# Patient Record
Sex: Male | Born: 1985 | Race: White | Marital: Single | Smoking: Current every day smoker
Health system: Southern US, Community
[De-identification: ages and names within clinical notes are randomized; demographics above are authoritative.]

## PROBLEM LIST (undated history)

## (undated) DIAGNOSIS — F329 Major depressive disorder, single episode, unspecified: Secondary | ICD-10-CM

## (undated) DIAGNOSIS — F411 Generalized anxiety disorder: Secondary | ICD-10-CM

## (undated) DIAGNOSIS — F259 Schizoaffective disorder, unspecified: Secondary | ICD-10-CM

## (undated) HISTORY — PX: SPLENECTOMY: SUR1306

---

## 2012-12-30 DIAGNOSIS — F411 Generalized anxiety disorder: Secondary | ICD-10-CM | POA: Insufficient documentation

## 2013-12-30 DIAGNOSIS — F1020 Alcohol dependence, uncomplicated: Secondary | ICD-10-CM | POA: Insufficient documentation

## 2016-07-30 ENCOUNTER — Emergency Department
Admission: EM | Admit: 2016-07-30 | Discharge: 2016-08-02 | Disposition: A | Discharge status: Other Acute Inpatient Hospital | Payer: Self-pay | Attending: Emergency Medicine | Admitting: Emergency Medicine

## 2016-07-30 ENCOUNTER — Encounter: Payer: Self-pay | Admitting: Emergency Medicine

## 2016-07-30 DIAGNOSIS — F1220 Cannabis dependence, uncomplicated: Secondary | ICD-10-CM

## 2016-07-30 DIAGNOSIS — F11251 Opioid dependence with opioid-induced psychotic disorder with hallucinations: Secondary | ICD-10-CM | POA: Insufficient documentation

## 2016-07-30 DIAGNOSIS — F329 Major depressive disorder, single episode, unspecified: Secondary | ICD-10-CM | POA: Insufficient documentation

## 2016-07-30 DIAGNOSIS — F1020 Alcohol dependence, uncomplicated: Secondary | ICD-10-CM

## 2016-07-30 DIAGNOSIS — F29 Unspecified psychosis not due to a substance or known physiological condition: Secondary | ICD-10-CM | POA: Insufficient documentation

## 2016-07-30 DIAGNOSIS — R45851 Suicidal ideations: Secondary | ICD-10-CM | POA: Insufficient documentation

## 2016-07-30 DIAGNOSIS — Z79899 Other long term (current) drug therapy: Secondary | ICD-10-CM | POA: Insufficient documentation

## 2016-07-30 DIAGNOSIS — F23 Brief psychotic disorder: Secondary | ICD-10-CM

## 2016-07-30 HISTORY — DX: Schizoaffective disorder, unspecified: F25.9

## 2016-07-30 HISTORY — DX: Major depressive disorder, single episode, unspecified: F32.9

## 2016-07-30 HISTORY — DX: Generalized anxiety disorder: F41.1

## 2016-07-30 LAB — COMPREHENSIVE METABOLIC PANEL
ALBUMIN: 4.5 g/dL (ref 3.5–5.0)
ALK PHOS: 61 U/L (ref 38–126)
ALT: 20 U/L (ref 17–63)
AST: 23 U/L (ref 15–41)
Anion gap: 9 (ref 5–15)
BILIRUBIN TOTAL: 0.3 mg/dL (ref 0.3–1.2)
BUN: 10 mg/dL (ref 6–20)
CALCIUM: 9.2 mg/dL (ref 8.9–10.3)
CO2: 23 mmol/L (ref 22–32)
Chloride: 105 mmol/L (ref 101–111)
Creatinine, Ser: 0.68 mg/dL (ref 0.61–1.24)
GFR calc Af Amer: >60 mL/min (ref >60)
GFR calc non Af Amer: >60 mL/min (ref >60)
GLUCOSE: 126 mg/dL — AB (ref 65–99)
POTASSIUM: 4 mmol/L (ref 3.5–5.1)
Sodium: 137 mmol/L (ref 135–145)
TOTAL PROTEIN: 7.7 g/dL (ref 6.5–8.1)

## 2016-07-30 LAB — URINE DRUG SCREEN, QUALITATIVE (ARMC ONLY)
AMPHETAMINES, UR SCREEN: NOT DETECTED
BENZODIAZEPINE, UR SCRN: NOT DETECTED
Barbiturates, Ur Screen: NOT DETECTED
COCAINE METABOLITE, UR ~~LOC~~: NOT DETECTED
Cannabinoid 50 Ng, Ur ~~LOC~~: POSITIVE — AB
MDMA (ECSTASY) UR SCREEN: NOT DETECTED
Methadone Scn, Ur: NOT DETECTED
OPIATE, UR SCREEN: NOT DETECTED
PHENCYCLIDINE (PCP) UR S: NOT DETECTED
Tricyclic, Ur Screen: POSITIVE — AB

## 2016-07-30 LAB — CBC
HEMATOCRIT: 41.3 % (ref 40.0–52.0)
HEMOGLOBIN: 14.2 g/dL (ref 13.0–18.0)
MCH: 30.1 pg (ref 26.0–34.0)
MCHC: 34.3 g/dL (ref 32.0–36.0)
MCV: 87.7 fL (ref 80.0–100.0)
Platelets: 373 10*3/uL (ref 150–440)
RBC: 4.71 MIL/uL (ref 4.40–5.90)
RDW: 13.8 % (ref 11.5–14.5)
WBC: 8 10*3/uL (ref 3.8–10.6)

## 2016-07-30 LAB — ETHANOL: Alcohol, Ethyl (B): 94 mg/dL — ABNORMAL HIGH (ref <5)

## 2016-07-30 LAB — ACETAMINOPHEN LEVEL: Acetaminophen (Tylenol), Serum: <10 ug/mL — ABNORMAL LOW (ref 10–30)

## 2016-07-30 LAB — SALICYLATE LEVEL: Salicylate Lvl: <7 mg/dL (ref 2.8–30.0)

## 2016-07-30 MED ORDER — SERTRALINE HCL 50 MG PO TABS
50.0000 mg | ORAL_TABLET | Freq: Every day | ORAL | Status: DC
  Administered 2016-07-30 – 2016-08-01 (×3): 50 mg via ORAL
  Filled 2016-07-30 (×3): qty 1

## 2016-07-30 MED ORDER — BENZTROPINE MESYLATE 1 MG PO TABS
0.5000 mg | ORAL_TABLET | Freq: Two times a day (BID) | ORAL | Status: DC
  Administered 2016-07-30 – 2016-08-01 (×5): 0.5 mg via ORAL
  Filled 2016-07-30 (×5): qty 1

## 2016-07-30 MED ORDER — QUETIAPINE FUMARATE 25 MG PO TABS
100.0000 mg | ORAL_TABLET | Freq: Every day | ORAL | Status: DC
  Administered 2016-07-31 – 2016-08-01 (×2): 100 mg via ORAL
  Filled 2016-07-30 (×2): qty 4

## 2016-07-30 MED ORDER — BENZTROPINE MESYLATE 1 MG/ML IJ SOLN
0.5000 mg | Freq: Two times a day (BID) | INTRAMUSCULAR | Status: DC
  Filled 2016-07-30: qty 0.5

## 2016-07-30 MED ORDER — QUETIAPINE FUMARATE 25 MG PO TABS: 100.0000 mg | ORAL_TABLET | Freq: Two times a day (BID) | ORAL | Status: DC

## 2016-07-30 MED ORDER — QUETIAPINE FUMARATE 200 MG PO TABS
200.0000 mg | ORAL_TABLET | Freq: Every day | ORAL | Status: DC
  Administered 2016-07-30 – 2016-07-31 (×2): 200 mg via ORAL
  Filled 2016-07-30 (×2): qty 1

## 2016-07-30 MED ORDER — QUETIAPINE FUMARATE 200 MG PO TABS: 200.0000 mg | ORAL_TABLET | Freq: Every day | ORAL | Status: DC

## 2016-07-30 MED ORDER — QUETIAPINE FUMARATE 25 MG PO TABS
100.0000 mg | ORAL_TABLET | Freq: Every day | ORAL | Status: DC
  Administered 2016-07-30: 100 mg via ORAL
  Filled 2016-07-30: qty 4

## 2016-07-30 MED ORDER — LORAZEPAM 1 MG PO TABS
1.0000 mg | ORAL_TABLET | Freq: Four times a day (QID) | ORAL | Status: DC | PRN
  Administered 2016-07-31 – 2016-08-01 (×5): 1 mg via ORAL
  Filled 2016-07-30 (×6): qty 1

## 2016-07-30 MED ORDER — OLANZAPINE 5 MG PO TABS
5.0000 mg | ORAL_TABLET | ORAL | Status: AC
Start: 2016-07-30 — End: 2016-07-30
  Administered 2016-07-30: 5 mg via ORAL
  Filled 2016-07-30: qty 1

## 2016-07-30 NOTE — ED Notes (Signed)

## 2016-07-30 NOTE — ED Notes (Signed)
SOC in room. 

## 2016-07-30 NOTE — Progress Notes (Signed)
Delos Haringhillips, Idalee Foxworthy A, RN [07/30/16 2153]  D:31 yr male who presents IVC in no acute distress for the treatment of SI and Depression. Pt appears flat and depressed, anxious and a little paranoid, with blocking at times. Pt was calm and cooperative [facility]. Pt presents with passive SI AH and contracts for safety . Pt denies HI/VH .  A: POC and unit policies explained and understanding verbalized. Food and fluids offered, and accepted. Pt given nigt medications  R: Pt had no additional questions or concerns.

## 2016-07-30 NOTE — BH Assessment (Signed)
Assessment Note  Patrick [name] is an 31 y.o. male who presents to ED today with c/o "Just been hearing voices like I usually do ... Bad anxiety ... Depression ... I haven't been leaving my apartment ... It's been lasting about a week". Pt reports these auditory hallucinations to be command voices telling him to kill himself. Pt further reports Hx of self-harm by cutting; he states he last cut "a couple of months ago". He is currently prescribed Zyprexa which he was prescribed at his last hospitalization (a month ago) at a hospital located in [place], [place]. Pt reports he was admitted for suicidal ideations and hallucinations for 5 days. It appears that the previous hospitalization was the onset of his symptoms as he denies any other hospitalizations prior to the admission in [place], [place]. He states he and his fiance recently relocated to this area and he has not been able to get linked with a local mental health provider. He reports his current medications are ineffective; however, he states he has been compliant with Rx. He identified several depressive symptoms: isolating, "feeling like I'm going to jump out my skin", insomnia, agitation. He also reports Hx of cutting behaviors, which he states he has not cut in "a couple of months". Pt denied HI/delusions. He reports decreased sleep patterns (3 hrs) and poor appetite ("I have not eaten much in 2 days").  Pt stared at the floor during TTS assessment with an extremely blunted affect. He did not appear to be responding to internal stimuli and was cooperative with this Clinical research associate.  Diagnosis: Schizoaffective Disorder, by history  Past Medical History:  Past Medical History:  Diagnosis Date  . Generalized anxiety disorder   . Major depressive disorder   . Schizoaffective disorder [facility])     Past Surgical History:  Procedure Laterality Date  . SPLENECTOMY      Family History: No family history on file.  Social History:  reports that he has been smoking  Cigarettes.  He has been smoking about 1.00 pack per day. He has never used smokeless tobacco. He reports that he drinks alcohol. He reports that he uses drugs, including Marijuana.  Additional Social History:  Alcohol / Drug Use Pain Medications: None Reported Prescriptions: None Reported Over the Counter: None Reported History of alcohol / drug use?: Yes Longest period of sobriety (when/how long): UKN Negative Consequences of Use: Financial Withdrawal Symptoms:  (None Reported) Substance #1 Name of Substance 1: Marajuana 1 - Age of First Use: Unable to recall 1 - Amount (size/oz): Unable to recall 1 - Frequency: "not all the time" 1 - Duration: UKN 1 - Last Use / Amount: Unable to recall  CIWA: CIWA-Ar BP: 119/73 Pulse Rate: (!) 119 COWS:    Allergies:  Allergies  Allergen Reactions  . Sulfa Antibiotics Anaphylaxis  . Antihistamines, Chlorpheniramine-Type Hives    Home Medications:  (Not in a hospital admission)  OB/GYN Status:  No LMP for male patient.  General Assessment Data Location of Assessment: [facility] ED TTS Assessment: In system Is this a Tele or Face-to-Face Assessment?: Face-to-Face Is this an Initial Assessment or a Re-assessment for this encounter?: Initial Assessment Marital status: Long term relationship Maiden name: N/A Is patient pregnant?: No Pregnancy Status: No Living Arrangements: Spouse/significant other Can pt return to current living arrangement?: Yes Admission Status: Involuntary Is patient capable of signing voluntary admission?: Yes Referral Source: Self/Family/Friend Insurance type: None  Medical Screening Exam [facility] Walk-in ONLY) Medical Exam completed: Yes  Crisis Care Plan Living Arrangements:  Spouse/significant other Legal Guardian: Other: (Self) Name of Psychiatrist: None Name of Therapist: None  Education Status Is patient currently in school?: No Current Grade: N/A Highest grade of school patient has completed: UKN Name  of school: N/A Contact person: N/A  Risk to self with the past 6 months Suicidal Ideation: Yes-Currently Present (Hallucinations w/ command) Has patient been a risk to self within the past 6 months prior to admission? : Yes Suicidal Intent: Yes-Currently Present Has patient had any suicidal intent within the past 6 months prior to admission? : Yes Is patient at risk for suicide?: Yes Suicidal Plan?: No Has patient had any suicidal plan within the past 6 months prior to admission? : No Access to Means: No What has been your use of drugs/alcohol within the last 12 months?: Marijuana Previous Attempts/Gestures: No How many times?: 0 Other Self Harm Risks: None Reported Triggers for Past Attempts: Unknown Intentional Self Injurious Behavior: Cutting Comment - Self Injurious Behavior: Pt reports Hx of cutting behaviors Family Suicide History: Unknown Recent stressful life event(s): Other (Comment) (Hallucinations) Persecutory voices/beliefs?: Yes Depression: Yes Depression Symptoms: Despondent, Insomnia, Isolating, Feeling angry/irritable, Loss of interest in usual pleasures, Feeling worthless/self pity Substance abuse history and/or treatment for substance abuse?: Yes Suicide prevention information given to non-admitted patients: Yes  Risk to Others within the past 6 months Homicidal Ideation: No Does patient have any lifetime risk of violence toward others beyond the six months prior to admission? : No Thoughts of Harm to Others: No Current Homicidal Intent: No Current Homicidal Plan: No Access to Homicidal Means: No Identified Victim: N/A History of harm to others?: No Assessment of Violence: None Noted Violent Behavior Description: N/A Does patient have access to weapons?: No Criminal Charges Pending?: No Does patient have a court date: No Is patient on probation?: No  Psychosis Hallucinations: Auditory, With command Delusions: None noted  Mental Status  Report Appearance/Hygiene: In scrubs Eye Contact: Poor (Pt look down at the floor during TTS assessment) Motor Activity: Unremarkable Speech: Logical/coherent Level of Consciousness: Alert Mood: Depressed Affect: Preoccupied, Blunted, Flat Anxiety Level: Moderate Thought Processes: Coherent, Relevant Judgement: Partial Orientation: Person, Place, Time, Situation, Appropriate for developmental age Obsessive Compulsive Thoughts/Behaviors: None  Cognitive Functioning Concentration: Normal Memory: Recent Intact, Remote Intact IQ: Average Insight: Fair Impulse Control: Fair Appetite: Poor Weight Loss: 0 (UKN) Weight Gain: 0 Sleep: Decreased Total Hours of Sleep: 3 Vegetative Symptoms: Staying in bed  ADLScreening [facility] Assessment Services) Patient's cognitive ability adequate to safely complete daily activities?: Yes Patient able to express need for assistance with ADLs?: Yes Independently performs ADLs?: Yes (appropriate for developmental age)  Prior Inpatient Therapy Prior Inpatient Therapy: Yes Prior Therapy Dates: "a month ago" Prior Therapy Facilty/Provider(s): [facility] Reason for Treatment: "samething as today"  Prior Outpatient Therapy Prior Outpatient Therapy: No Prior Therapy Dates: N/A Prior Therapy Facilty/Provider(s): N/A Reason for Treatment: N/A Does patient have an ACCT team?: No Does patient have Intensive In-House Services?  : No Does patient have [facility] services? : No Does patient have P4CC services?: No  ADL Screening (condition at time of admission) Patient's cognitive ability adequate to safely complete daily activities?: Yes Patient able to express need for assistance with ADLs?: Yes Independently performs ADLs?: Yes (appropriate for developmental age)       Abuse/Neglect Assessment (Assessment to be complete while patient is alone) Physical Abuse: Denies Verbal Abuse: Denies Sexual Abuse: Denies Exploitation of patient/patient's  resources: Denies Self-Neglect: Denies Values / Beliefs Cultural Requests During Hospitalization: None  Spiritual Requests During Hospitalization: None Consults Spiritual Care Consult Needed: No Social Work Consult Needed: No Merchant navy officerAdvance Directives (For Healthcare) Does Patient Have a Medical Advance Directive?: No    Additional Information 1:1 In Past 12 Months?: No CIRT Risk: No Elopement Risk: No Does patient have medical clearance?: Yes  Child/Adolescent Assessment Running Away Risk:  (Patient is an adult)  Disposition:  Disposition Initial Assessment Completed for this Encounter: Yes Disposition of Patient: Referred to [facility](SOC Consult) Patient referred to: Other (Comment) [facility](SOC Consult)  On Site Evaluation by:   Reviewed with Physician:    Wilmon ArmsSTEVENSON, SHALETA 07/30/2016 6:33 PM

## 2016-07-30 NOTE — ED Notes (Signed)
Meal tray given 

## 2016-07-30 NOTE — ED Triage Notes (Signed)
Pt to ED via POV, pt states that he has a hx/o schizoaffective disorder. Pt has been off his medication for a week. PT states that he stopped taking his medication because he felt like it was not working and he was having side effects from the medication. Pt states that he is having auditory hallucination, voices are telling him to kill himself. Pt states that he is also having thoughts of self-harm with plan. Pt has hx/o cutting and states that he would plan to cut himself again. Pt denies thoughts of harming anyone else.
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
## 2016-07-30 NOTE — ED Notes (Signed)
Pt brought to the unit , pt expressed his increased anxiety. Pt reassured, given a meal and medication. Pt resting comfortably at this time

## 2016-07-31 MED ORDER — LORAZEPAM 1 MG PO TABS
1.0000 mg | ORAL_TABLET | Freq: Once | ORAL | Status: AC
  Administered 2016-07-31: 1 mg via ORAL

## 2016-07-31 MED ORDER — NICOTINE POLACRILEX 2 MG MT GUM
2.0000 mg | CHEWING_GUM | OROMUCOSAL | Status: DC | PRN
  Administered 2016-07-31 – 2016-08-01 (×11): 2 mg via ORAL
  Filled 2016-07-31 (×11): qty 1

## 2016-07-31 NOTE — ED Notes (Signed)
Patient watching TV in room. No noted distress or abnormal behaviors noted. Will continue 15 minute checks and observation by security camera for safety. 

## 2016-07-31 NOTE — ED Notes (Signed)
Pt came to nurses station complaining of increased auditory hallucinations. Pt notified EDP and administered medication as ordered. Maintained on 15 minute checks and observation by security camera for safety.

## 2016-07-31 NOTE — ED Notes (Signed)
Pt. Alert and oriented, warm and dry, in no distress. Pt. Denies HI, and VH. Pt complaining of AV that are really loud. Patient states, "They are really loud and telling me to hurt myself. That pill I took earlier is not helping."  Pt. Encouraged to let nursing staff know of any concerns or needs.

## 2016-07-31 NOTE — ED Notes (Signed)
Patient resting quietly in room. No noted distress or abnormal behaviors noted. Will continue 15 minute checks and observation by security camera for safety. 

## 2016-07-31 NOTE — ED Notes (Signed)
Patient asleep in room. No noted distress or abnormal behavior. Will continue 15 minute checks and observation by security cameras for safety. 

## 2016-07-31 NOTE — Progress Notes (Signed)
   [facility] ([phone]),Called and left message awaiting call back    [facility] [phone]),Spoke to [place] patient is under review ( follow up Monday)    [facility] [phone]), [facility] try again on Monday   [facility] [phone]), As per [facility] No beds   [facility] [phone]) As per Selena BattenKim only accepts patient (5-17 or 50+) Patient declined   [facility] [phone]), Spoke to [facility] No beds    [facility] [phone]), They have some beds patients under review as per Joie BimlerLacy   [facility] [phone]).Left a message awaiting a call back   [facility] per [facility]  no beds

## 2016-07-31 NOTE — ED Provider Notes (Signed)
-----------------------------------------   4:14 AM on 07/31/2016 -----------------------------------------   Blood pressure 112/63, pulse 77, temperature 97.7 F (36.5 C), temperature source Oral, resp. rate 18, height 5\' 10"  (1.778 m), weight 175 lb (79.4 kg), SpO2 95 %.  The patient had no acute events since last update.  Calm and cooperative at this time.  Disposition is pending Psychiatry/Behavioral Medicine team recommendations.     Merrily Brittleifenbark, Thai Hemrick, MD 07/31/16 [phone]

## 2016-07-31 NOTE — ED Notes (Signed)
EDP made aware of pt still hearing voices and having some passive SI because of voices. EDP ordered to give HS medications early.

## 2016-07-31 NOTE — ED Notes (Signed)
Pt complaining of increasing anxiety. PRN medication administered as ordered, Pt requesting nicorette gum (Pt stated the nicotine patch gives him a rash).  EDP will be notified. Pt pacing in room. Maintained on 15 minute checks and observation by security camera for safety.

## 2016-07-31 NOTE — Progress Notes (Signed)
Referral information for Psychiatric Hospitalization faxed to;    [facility] ([phone]),    [facility] ([phone] or [phone]   [facility] ([phone]),    [facility] ([phone]),    [facility] ([phone]),    [facility] ([phone]),    [facility] ([phone])   [facility] ([phone]),    [facility] ([phone] or [phone]),    [facility] ([phone]),    [facility] ([phone]).  This patient information was faxed out to the following facilities at 8:30am LCSW will call to see if there is any beds.   Brannen Koppen LCSW [phone] 

## 2016-08-01 DIAGNOSIS — F1020 Alcohol dependence, uncomplicated: Secondary | ICD-10-CM

## 2016-08-01 DIAGNOSIS — F1220 Cannabis dependence, uncomplicated: Secondary | ICD-10-CM

## 2016-08-01 DIAGNOSIS — F250 Schizoaffective disorder, bipolar type: Secondary | ICD-10-CM

## 2016-08-01 MED ORDER — QUETIAPINE FUMARATE 300 MG PO TABS
300.0000 mg | ORAL_TABLET | Freq: Every day | ORAL | Status: DC
  Administered 2016-08-01: 300 mg via ORAL
  Filled 2016-08-01: qty 1

## 2016-08-01 NOTE — BH Assessment (Signed)
[facility] Dr. Toni Amendlapacs.  Attending Physician will be Dr. Jennet MaduroPucilowska.   Patient has [facility] room 306, [facility] [place].   ER staff is aware of the admission (Dr. Alphonzo LemmingsMcShane, ER MD & Myia, Patient Access).

## 2016-08-01 NOTE — ED Notes (Signed)
PT IVC/ PENDING PLACEMENT  

## 2016-08-01 NOTE — Consult Note (Signed)
[facility] Consult   Reason for Consult:  Consult for 31 year old man with a history of recurrent mental health issues and substance abuse who presented to the emergency room complaining of psychosis and suicidal thoughts Referring Physician:  McShane Patient Identification: Patrick [name] MRN:  [id number] Principal Diagnosis: Schizoaffective disorder [facility](HCC) Diagnosis:   Patient Active Problem List   Diagnosis Date Noted  . Schizoaffective disorder (HCC) [F25.9] 08/01/2016  . Cannabis abuse [F12.10] 08/01/2016  . Alcohol abuse [F10.10] 08/01/2016    Total Time spent with patient: 45 minutes  Subjective:   Patrick [name] is a 31 y.o. male patient admitted with "I have vivid auditory hallucinations".  HPI:  Patient interviewed. Chart reviewed. 31 year old man came to the emergency room over the weekend saying he had auditory hallucinations which were loud and disruptive. Can't describe them very well. Says that he has been sleeping poorly. Mood has been angry and irritable. The current spell of hallucinations and bad mood has been going on for about 1-1/2 weeks. He has a history of a lot of self injury and arm cutting. History of suicide attempts in the past. Claims that he was prescribed Seroquel and Zyprexa at various times in the past as well as Zoloft and also claims that he is supposed to be on Ativan 2 mg twice a day minimizes alcohol use minimizes the degree to which drugs are part of his problem. Financial stresses.  Social history: Says he lives here in town with his fiance. Not currently working. Claims to of moved here from [place] just about 3 weeks ago.  Substance abuse history: Drug screen positive for alcohol and cannabis. Patient minimizes the degree to which they are a problem. Notes from [facility] suggest benzodiazepine abuse has been considered an issue.  Medical history: No history of heart disease diabetes thyroid disease seizures or other medical problems  Past  Psychiatric History: Multiple prior presentations to emergency rooms and says he's had multiple prior hospitalizations most recently [facility] just within the last couple weeks. Stayed there for just a day or so. Was not discharged on Ativan. Says he had a past suicide attempt about 2 years ago when he tried to hang himself. Unclear diagnosis but chart suggests schizoaffective disorder  Risk to Self: Suicidal Ideation: Yes-Currently Present (Hallucinations w/ command) Suicidal Intent: Yes-Currently Present Is patient at risk for suicide?: Yes Suicidal Plan?: No Access to Means: No What has been your use of drugs/alcohol within the last 12 months?: Marijuana How many times?: 0 Other Self Harm Risks: None Reported Triggers for Past Attempts: Unknown Intentional Self Injurious Behavior: Cutting Comment - Self Injurious Behavior: Pt reports Hx of cutting behaviors Risk to Others: Homicidal Ideation: No Thoughts of Harm to Others: No Current Homicidal Intent: No Current Homicidal Plan: No Access to Homicidal Means: No Identified Victim: N/A History of harm to others?: No Assessment of Violence: None Noted Violent Behavior Description: N/A Does patient have access to weapons?: No Criminal Charges Pending?: No Does patient have a court date: No Prior Inpatient Therapy: Prior Inpatient Therapy: Yes Prior Therapy Dates: "a month ago" Prior [facility] Reason for Treatment: "samething as today" Prior Outpatient Therapy: Prior Outpatient Therapy: No Prior Therapy Dates: N/A Prior Therapy Facilty/Provider(s): N/A Reason for Treatment: N/A Does patient have an ACCT team?: No Does patient have Intensive In-House Services?  : No Does patient have [facility] services? : No Does patient have P4CC services?: No  Past Medical History:  Past Medical History:  Diagnosis Date  . Generalized anxiety disorder   . Major depressive disorder   . Schizoaffective  disorder [facility])     Past Surgical History:  Procedure Laterality Date  . SPLENECTOMY     Family History: No family history on file. Family Psychiatric  History: Says that his father's side of the family has a lot of people with anxiety depression and alcohol abuse Social History:  History  Alcohol Use  . Yes    Comment: Occ     History  Drug Use  . Types: Marijuana    Comment: Occ    Social History   Social History  . Marital status: Single    Spouse name: N/A  . Number of children: N/A  . Years of education: N/A   Social History Main Topics  . Smoking status: Current Every Day Smoker    Packs/day: 1.00    Types: Cigarettes  . Smokeless tobacco: Never Used  . Alcohol use Yes     Comment: Occ  . Drug use: Yes    Types: Marijuana     Comment: Occ  . Sexual activity: Not Asked   Other Topics Concern  . None   Social History Narrative  . None   Additional Social History:    Allergies:   Allergies  Allergen Reactions  . Sulfa Antibiotics Anaphylaxis  . Antihistamines, Chlorpheniramine-Type Hives  . Diphenhydramine Hcl Palpitations    Labs: No results found for this or any previous visit (from the past 48 hour(s)).  Current Facility-Administered Medications  Medication Dose Route Frequency Provider Last Rate Last Dose  . benztropine (COGENTIN) tablet 0.5 mg  0.5 mg Oral BID Sharman Cheek, MD   0.5 mg at 08/01/16 1610  . nicotine polacrilex (NICORETTE) gum 2 mg  2 mg Oral PRN Willy Eddy, MD   2 mg at 08/01/16 1707  . QUEtiapine (SEROQUEL) tablet 100 mg  100 mg Oral Daily Sharman Cheek, MD   100 mg at 08/01/16 0925  . QUEtiapine (SEROQUEL) tablet 300 mg  300 mg Oral QHS Peighton Edgin T, MD      . sertraline (ZOLOFT) tablet 50 mg  50 mg Oral Daily Sharman Cheek, MD   50 mg at 08/01/16 9604   Current Outpatient Prescriptions  Medication Sig Dispense Refill  . buprenorphine-naloxone (SUBOXONE) 8-2 mg SUBL SL tablet Place 2 tablets under the  tongue daily.    Marland Kitchen OLANZapine (ZYPREXA) 10 MG tablet Take 10 mg by mouth at bedtime.    Marland Kitchen OLANZapine (ZYPREXA) 5 MG tablet Take 5 mg by mouth daily.    . sertraline (ZOLOFT) 100 MG tablet Take 100 mg by mouth daily.      Musculoskeletal: Strength & Muscle Tone: within normal limits Gait & Station: normal Patient leans: N/A  Psychiatric Specialty Exam: Physical Exam  Nursing note and vitals reviewed. Constitutional: He appears well-developed and well-nourished.  HENT:  Head: Normocephalic and atraumatic.  Eyes: Conjunctivae are normal. Pupils are equal, round, and reactive to light.  Neck: Normal range of motion.  Cardiovascular: Regular rhythm and normal heart sounds.   Respiratory: Effort normal. No respiratory distress.  GI: Soft.  Musculoskeletal: Normal range of motion.  Neurological: He is alert.  Skin: Skin is warm and dry.  Psychiatric: His mood appears anxious. His affect is angry. His speech is tangential. He is agitated. He expresses impulsivity. He exhibits a depressed mood. He expresses suicidal ideation. He exhibits abnormal recent memory.    Review of Systems  Constitutional: Negative.  HENT: Negative.   Eyes: Negative.   Respiratory: Negative.   Cardiovascular: Negative.   Gastrointestinal: Negative.   Musculoskeletal: Negative.   Skin: Negative.   Neurological: Negative.   Psychiatric/Behavioral: Positive for depression, hallucinations, substance abuse and suicidal ideas. The patient is nervous/anxious and has insomnia.     Blood pressure 102/60, pulse 93, temperature 98.8 F (37.1 C), temperature source Oral, resp. rate 20, height 5\' 10"  (1.778 m), weight 79.4 kg (175 lb), SpO2 100 %.Body mass index is 25.11 kg/m.  General Appearance: Disheveled  Eye Contact:  Fair  Speech:  Slow  Volume:  Decreased  Mood:  Angry  Affect:  Congruent  Thought Process:  Goal Directed  Orientation:  Full (Time, Place, and Person)  Thought Content:  Rumination and  Tangential  Suicidal Thoughts:  Yes.  with intent/plan  Homicidal Thoughts:  No  Memory:  Immediate;   Good Recent;   Fair Remote;   Fair  Judgement:  Fair  Insight:  Fair  Psychomotor Activity:  Decreased  Concentration:  Concentration: Fair  Recall:  [employer] of Knowledge:  Fair  Language:  Fair  Akathisia:  No  Handed:  Right  AIMS (if indicated):     Assets:  Desire for Improvement Housing Physical Health  ADL's:  Intact  Cognition:  Impaired,  Mild  Sleep:        Treatment Plan Summary: Daily contact with patient to assess and evaluate symptoms and progress in treatment, Medication management and Plan 32 year old man who presents with complaints of hallucinations and suicidal ideation. When I went to speak to him he was shaking in a very dramatic way all over. Despite this and despite his specific requests for increased lorazepam dosage I don't think there is any indication that he needs Ativan. Discontinue that medicine. He claims that he only had a little alcohol the day before coming in here and doesn't drink regularly so there is no indication for detox. No evidence that he is regularly prescribed Ativan. I will continue his Seroquel. Increased dosage towards the antipsychotic range. Patient continues to insist on his being acutely dangerous and so we will plan to admit him to the psychiatric ward. Orders completed case reviewed with TTS. Full set of labs and EKG will be done.  Disposition: Recommend psychiatric Inpatient admission when medically cleared. Supportive therapy provided about ongoing stressors.  Mordecai Rasmussen, MD 08/01/2016 5:55 PM

## 2016-08-01 NOTE — ED Notes (Signed)
Pt up multiple times asking for multiple PRN medications. Patient so far has received two nicotine gum pieces once at 1959 and again at 2218. Patient also received PRN dose of ativan at 2352. It appears patient is resting in bed with eyes closed at this time.

## 2016-08-01 NOTE — BH Assessment (Signed)
Writer received phone call from [employer] Coordinator ([phone]) asking about disposition and requesting to be involved in the discharge plan. Writer informed him he is recommended for inpatient and at this time he has been referred to other hospitals due to capacity.

## 2016-08-01 NOTE — ED Provider Notes (Signed)
-----------------------------------------   6:56 AM on 08/01/2016 -----------------------------------------   Blood pressure 111/66, pulse 60, temperature 98.1 F (36.7 C), temperature source Oral, resp. rate 16, height 5\' 10"  (1.778 m), weight 175 lb (79.4 kg), SpO2 100 %.  The patient had no acute events since last update.  Calm and cooperative at this time.  The patient has been recommended for inpatient admission according to the specialist on-call.     Rebecka ApleyWebster, Allison P, MD 08/01/16 [phone]

## 2016-08-01 NOTE — ED Notes (Signed)
Patient is alert and oriented x 4.  Presents with irritable affect and anxious mood.  Reports auditory hallucination and suicidal thoughts but contracts for safety.  Medication given as prescribed.  Ativan 1 mg given for complain of severe anxiety and racing thoughts with fair effect.  Reports medication not effective enough.  Patient encouraged to used coping skills.  Routine safety checks maintained.  Patient observed pacing his room most of this shift.  Patient safe on the unit.

## 2016-08-02 ENCOUNTER — Inpatient Hospital Stay
Admission: RE | Admit: 2016-08-02 | Discharge: 2016-08-05 | DRG: 885 | Disposition: A | Discharge status: Home / Self Care | Payer: No Typology Code available for payment source | Source: Intra-hospital | Attending: Psychiatry | Admitting: Psychiatry

## 2016-08-02 DIAGNOSIS — F10239 Alcohol dependence with withdrawal, unspecified: Secondary | ICD-10-CM | POA: Diagnosis present

## 2016-08-02 DIAGNOSIS — F10939 Alcohol use, unspecified with withdrawal, unspecified: Secondary | ICD-10-CM | POA: Diagnosis present

## 2016-08-02 DIAGNOSIS — Y904 Blood alcohol level of 80-99 mg/100 ml: Secondary | ICD-10-CM | POA: Diagnosis present

## 2016-08-02 DIAGNOSIS — F429 Obsessive-compulsive disorder, unspecified: Secondary | ICD-10-CM | POA: Diagnosis present

## 2016-08-02 DIAGNOSIS — F251 Schizoaffective disorder, depressive type: Principal | ICD-10-CM | POA: Diagnosis present

## 2016-08-02 DIAGNOSIS — Z915 Personal history of self-harm: Secondary | ICD-10-CM | POA: Diagnosis not present

## 2016-08-02 DIAGNOSIS — F17210 Nicotine dependence, cigarettes, uncomplicated: Secondary | ICD-10-CM | POA: Diagnosis present

## 2016-08-02 DIAGNOSIS — Z888 Allergy status to other drugs, medicaments and biological substances status: Secondary | ICD-10-CM | POA: Diagnosis not present

## 2016-08-02 DIAGNOSIS — Z882 Allergy status to sulfonamides status: Secondary | ICD-10-CM | POA: Diagnosis not present

## 2016-08-02 DIAGNOSIS — Z79899 Other long term (current) drug therapy: Secondary | ICD-10-CM

## 2016-08-02 DIAGNOSIS — K0889 Other specified disorders of teeth and supporting structures: Secondary | ICD-10-CM | POA: Diagnosis present

## 2016-08-02 DIAGNOSIS — Z9119 Patient's noncompliance with other medical treatment and regimen: Secondary | ICD-10-CM | POA: Diagnosis not present

## 2016-08-02 DIAGNOSIS — F4310 Post-traumatic stress disorder, unspecified: Secondary | ICD-10-CM | POA: Diagnosis present

## 2016-08-02 DIAGNOSIS — Z79891 Long term (current) use of opiate analgesic: Secondary | ICD-10-CM

## 2016-08-02 DIAGNOSIS — R000 Tachycardia, unspecified: Secondary | ICD-10-CM | POA: Diagnosis present

## 2016-08-02 DIAGNOSIS — F1020 Alcohol dependence, uncomplicated: Secondary | ICD-10-CM | POA: Diagnosis present

## 2016-08-02 DIAGNOSIS — Z91199 Patient's noncompliance with other medical treatment and regimen due to unspecified reason: Secondary | ICD-10-CM

## 2016-08-02 DIAGNOSIS — R45851 Suicidal ideations: Secondary | ICD-10-CM | POA: Diagnosis present

## 2016-08-02 DIAGNOSIS — F410 Panic disorder [episodic paroxysmal anxiety] without agoraphobia: Secondary | ICD-10-CM | POA: Diagnosis present

## 2016-08-02 DIAGNOSIS — F1220 Cannabis dependence, uncomplicated: Secondary | ICD-10-CM | POA: Diagnosis present

## 2016-08-02 DIAGNOSIS — G4700 Insomnia, unspecified: Secondary | ICD-10-CM | POA: Diagnosis present

## 2016-08-02 DIAGNOSIS — Z813 Family history of other psychoactive substance abuse and dependence: Secondary | ICD-10-CM

## 2016-08-02 DIAGNOSIS — F411 Generalized anxiety disorder: Secondary | ICD-10-CM | POA: Diagnosis present

## 2016-08-02 DIAGNOSIS — Z5181 Encounter for therapeutic drug level monitoring: Secondary | ICD-10-CM

## 2016-08-02 DIAGNOSIS — F17200 Nicotine dependence, unspecified, uncomplicated: Secondary | ICD-10-CM | POA: Diagnosis present

## 2016-08-02 LAB — LIPID PANEL
CHOL/HDL RATIO: 3.3 ratio
CHOLESTEROL: 208 mg/dL — AB (ref 0–200)
HDL: 63 mg/dL (ref >40)
LDL Cholesterol: 114 mg/dL — ABNORMAL HIGH (ref 0–99)
TRIGLYCERIDES: 153 mg/dL — AB (ref <150)
VLDL: 31 mg/dL (ref 0–40)

## 2016-08-02 LAB — TSH: TSH: 2.43 u[IU]/mL (ref 0.350–4.500)

## 2016-08-02 MED ORDER — SERTRALINE HCL 25 MG PO TABS
50.0000 mg | ORAL_TABLET | Freq: Every day | ORAL | Status: DC
  Administered 2016-08-02: 50 mg via ORAL
  Filled 2016-08-02: qty 2

## 2016-08-02 MED ORDER — OLANZAPINE 10 MG PO TABS
10.0000 mg | ORAL_TABLET | Freq: Every day | ORAL | Status: DC
  Administered 2016-08-02 – 2016-08-05 (×4): 10 mg via ORAL
  Filled 2016-08-02 (×4): qty 1

## 2016-08-02 MED ORDER — QUETIAPINE FUMARATE 100 MG PO TABS
100.0000 mg | ORAL_TABLET | Freq: Every day | ORAL | Status: DC
  Administered 2016-08-02: 100 mg via ORAL
  Filled 2016-08-02: qty 1

## 2016-08-02 MED ORDER — BENZTROPINE MESYLATE 1 MG PO TABS
0.5000 mg | ORAL_TABLET | Freq: Two times a day (BID) | ORAL | Status: DC
  Administered 2016-08-02: 0.5 mg via ORAL
  Filled 2016-08-02: qty 1

## 2016-08-02 MED ORDER — CHLORDIAZEPOXIDE HCL 25 MG PO CAPS
50.0000 mg | ORAL_CAPSULE | Freq: Four times a day (QID) | ORAL | Status: AC
  Administered 2016-08-02 – 2016-08-05 (×12): 50 mg via ORAL
  Filled 2016-08-02 (×12): qty 2

## 2016-08-02 MED ORDER — NICOTINE 21 MG/24HR TD PT24
21.0000 mg | MEDICATED_PATCH | Freq: Every day | TRANSDERMAL | Status: DC
  Administered 2016-08-02 – 2016-08-05 (×4): 21 mg via TRANSDERMAL
  Filled 2016-08-02 (×4): qty 1

## 2016-08-02 MED ORDER — NICOTINE POLACRILEX 2 MG MT GUM
2.0000 mg | CHEWING_GUM | OROMUCOSAL | Status: DC | PRN
  Filled 2016-08-02: qty 1

## 2016-08-02 MED ORDER — ACETAMINOPHEN 325 MG PO TABS
650.0000 mg | ORAL_TABLET | Freq: Four times a day (QID) | ORAL | Status: DC | PRN
  Administered 2016-08-02 – 2016-08-05 (×7): 650 mg via ORAL
  Filled 2016-08-02 (×7): qty 2

## 2016-08-02 MED ORDER — MAGNESIUM HYDROXIDE 400 MG/5ML PO SUSP: 30.0000 mL | Freq: Every day | ORAL | Status: DC | PRN

## 2016-08-02 MED ORDER — QUETIAPINE FUMARATE 200 MG PO TABS
300.0000 mg | ORAL_TABLET | Freq: Every day | ORAL | Status: DC
  Administered 2016-08-02 – 2016-08-04 (×3): 300 mg via ORAL
  Filled 2016-08-02 (×3): qty 1

## 2016-08-02 MED ORDER — LITHIUM CARBONATE 300 MG PO CAPS
300.0000 mg | ORAL_CAPSULE | Freq: Three times a day (TID) | ORAL | Status: DC
  Administered 2016-08-02 – 2016-08-04 (×6): 300 mg via ORAL
  Filled 2016-08-02 (×7): qty 1

## 2016-08-02 MED ORDER — ALUM & MAG HYDROXIDE-SIMETH 200-200-20 MG/5ML PO SUSP
30.0000 mL | ORAL | Status: DC | PRN
  Administered 2016-08-04: 30 mL via ORAL
  Filled 2016-08-02 (×2): qty 30

## 2016-08-02 MED ORDER — HYDROXYZINE HCL 50 MG PO TABS: 50.0000 mg | ORAL_TABLET | Freq: Three times a day (TID) | ORAL | Status: DC | PRN

## 2016-08-02 NOTE — Social Work (Addendum)
Call from patient's care coordinator.  Confirms that patient is uninsured at present.  Met w patient for first time yesterday, "was pretty upset that MD had taken away his Ativan."  Reports patient is interested in locating "a psychiatrist" at discharge, declined all other services including substance abuse treatment.  Told care coordinator that his main concern was anxiety and wanting medication for "panic attacks."  Reports patient was hospitalized at [facility], care coordination referral originated at [place]  Feb 27 - June 02 2016; then at [facility] March 19 - March 21, then at [facility] April 9 - April 19, then at [place] April 13 - April 16.  One of the hospitalization were for depression, anxiety, alcohol abuse and/or alcohol intoxication, pt was referred to [place] during one hospital stay, most were substance abuse related admissions.  Care coordinator does not know what the aftercare plan was from these hospitalizations.  Care coordinator will contact [facility] to request additional information on patient's reason for admission and aftercare plan. From evidence/history available at [facility], appears that patient receives medication for anxiety upon discharge from inpatient; when prescription runs out, patient becomes symptomatic and presents for admission again.  Has history of Ativan use; has also been prescribed Gabapentin 300 mg, Seroquel 250 mg, Zoloft 150 mg; unknown prescriber per care coordinator.  Patient has recently moved to [place], no current outpatient providers.  Care coordinator wants to visit patient on unit to discuss aftercare planning.  Care coordinator will investigate IPRS provider for CST services.    Edwyna Shell, LCSW Lead Clinical Social Worker Phone:  [phone]

## 2016-08-02 NOTE — Tx Team (Addendum)
Initial Treatment Plan 08/02/2016 2:54 AM Patrick [name] ZOX:[id number]    PATIENT STRESSORS: Financial difficulties Marital or family conflict Medication change or noncompliance Substance abuse   PATIENT STRENGTHS: Capable of independent living Communication skills Physical Health Supportive family/friends   PATIENT IDENTIFIED PROBLEMS: "Need help with depression"  "Want the voices too stop"  Psychosis  Depression  Anxiety  Substance Abuse  Risk for suicide         DISCHARGE CRITERIA:  Ability to meet basic life and health needs Verbal commitment to aftercare and medication compliance Withdrawal symptoms are absent or subacute and managed without 24-hour nursing intervention  PRELIMINARY DISCHARGE PLAN: Return to previous living arrangement  PATIENT/FAMILY INVOLVEMENT: This treatment plan has been presented to and reviewed with the patient, Patrick [name], and/or family member.  The patient and family have been given the opportunity to ask questions and make suggestions.  Eveline KetoAdediran T Deah Ottaway, RN 08/02/2016, 2:54 AM

## 2016-08-02 NOTE — BHH Counselor (Signed)
Adult Comprehensive Assessment  Patient ID: Patrick LarsenWyatt [name], male   DOB: 1985/10/09, 31 y.o.   MRN: [id number]  Information Source: Information source: Patient  Current Stressors:  Housing / Lack of housing: [place] rented house in [place]; thinking about buying  Living/Environment/Situation:  Living Arrangements: Spouse/significant other Living conditions (as described by patient or guardian): lives w fiancee and 136 month old daughter How long has patient lived in current situation?: 2 weeks - looking for house to buy What is atmosphere in current home: Temporary  Family History:  Marital status: Long term relationship Long term relationship, how long?: "since we were both 18 - 11 to 12 years" What types of issues is patient dealing with in the relationship?: "we're cool, fine"; "she thinks its for the best that Im in here" Are you sexually active?: Yes What is your sexual orientation?: heterosexual Does patient have children?: Yes How many children?: 1 How is patient's relationship with their children?: 226 month old daughter at home; watches baby when grandparents cannot babysit  Childhood History:  By whom was/is the patient raised?: Mother Additional childhood history information: father died whn pt was [age], "he was an alcoholic, weekend dad";  Description of patient's relationship with caregiver when they were a child: good w mother, father deceased Patient's description of current relationship with people who raised him/her: mother:  "we're civil" Does patient have siblings?: No (only child) Did patient suffer any verbal/emotional/physical/sexual abuse as a child?: No Did patient suffer from severe childhood neglect?: No Has patient ever been sexually abused/assaulted/raped as an adolescent or adult?: No Was the patient ever a victim of a crime or a disaster?: No Witnessed domestic violence?: No Has patient been effected by domestic violence as an adult?: No  Education:  Highest  grade of school patient has completed: graduated from [employer]; [employer] for 2 years - "was studying beer and chicks" - would like to return to school for [employer] Currently a student?: No Name of school: none Learning disability?: No  Employment/Work Situation:   Employment situation: Unemployed Patient's job has been impacted by current illness: No What is the longest time patient has a held a job?: "gets trust fund from grandparents quarterly" - has enough to pay rent and expenses Has patient ever been in the [employer] and Companymilitary?: No Has patient ever served in combat?: No Did You Receive Any Psychiatric Treatment/Services While in [employer]the Military?: No Are There Guns or Other Weapons in Your Home?: No  Financial Resources:   Surveyor, quantityinancial resources: No income (trust fund income) Does patient have a Lawyerrepresentative payee or guardian?: No  Alcohol/Substance Abuse:   What has been your use of drugs/alcohol within the last 12 months?: "a lot of alcohol, more than a 12 pack/day", THC once/twice/week; "quite a bit of methamphetamine/day" - "I want to get sober, not stone cold sober but I dont want to have a problem w it";  Alcohol/Substance Abuse Treatment Hx: Past Tx, Inpatient, Past Tx, Outpatient If yes, describe treatment: [facility] [place] 89 [facility] [facility] - 25 (6 years ago); no outpatient tx, no 12 step groups "I hate them, dont like the people who go to them"; "I cant stay sober on my own for long, Idont know what I need" Has alcohol/substance abuse ever caused legal problems?: Yes (lost drivers license for 4 years - for 2 multiple DUIs; minor possessino charge in high [facility] - [employer] [employer])  Social Support System:   Patient's Community Support System: Good Describe  Community Support System: "my family is here"; fiancee has family in area also; "her family hates me" Type of faith/religion: Ephriam Knuckles How does patient's faith help to cope with current illness?: "I used to go to church"    Leisure/Recreation:   Leisure and Hobbies: "drink beer and watch hockey" "play Playstation"  Strengths/Needs:   What things does the patient do well?: "used to be a good Clinical research associate, creative" In what areas does patient struggle / problems for patient: staying sober, staying sane - "not being in a constant state of panic, like I want to die all the time, impending doom whatever it is"; couple of times/day - extreme anxiety - head pounds, hands sweat, chest tightens, feeling of impending doom"; "I just it to stop, sometimes I think about killing myself"; has cut arms - last time 2 years ago  Discharge Plan:   Does patient have access to transportation?: Yes Will patient be returning to same living situation after discharge?: Yes Currently receiving community mental health services: No If no, would patient like referral for services when discharged?: Yes (What county?) [facility]; no insurance)  Summary/Recommendations:   Emergency planning/management officer and Recommendations (to be completed by the evaluator): Patient is a 31 year old male, admitted involuntarily and diagnosed w Schizoaffective Disorder by history; per patient he feels he has Bipolar disorder.  Most significant symptom is anxiety - describes twice/daily instances of severe anxiety, no specific triggers.  Stable housing, income from [place] job and trust fund.  Has 106 month old daughter, recently moved back to area from [place].  Past history of substance abuse treatment at [facility] and [place] - no outpatient provider or 12 step involvement.   No current mental health providers in area; recently moved to [facility] and has not been able to establish w providers here.  Prior hospitalization in [place] approx 3 months ago.  No current medications.  Reports AVH, anxiety often w no specific triggers, depression, suicidal ideation, past history of non suicidal self injury.  Patient will benefit from hospitalization for crisis stabilization,  medication trial, group psychotherapy and psychoeducation.  Discharge case management will assist w after care referrals.  Goals of hospitalization include reduction in anxiety symptoms, increase in emotion regulation/mood stability/coping skills as well a medication trial.    Sallee Lange. 08/02/2016

## 2016-08-02 NOTE — Progress Notes (Signed)
Recreation Therapy Notes  INPATIENT RECREATION THERAPY ASSESSMENT  Patient Details Name: Daymon LarsenWyatt Bram MRN: [id number] DOB: 12-15-1985 Today's Date: 08/02/2016  Patient Stressors: Other (Comment) (Life; paying bills; looking after a 836 month old)  Coping Skills:   Isolate, Substance Abuse, Avoidance, Self-Injury, Music, Sports, Other (Comment) (Video games)  Personal Challenges: Anger, Communication, Concentration, Decision-Making, Problem-Solving, Self-Esteem/Confidence, Substance Abuse, Stress Management, Trusting Others  Leisure Interests (2+):  Games - Video games, Individual - Other (Comment) (Drink beer and watch hockey)  Awareness of Community Resources:  Yes  Community Resources:  [place]  Current Use: Yes  If no, Barriers?:    Patient Strengths:  "Not really"  Patient Identified Areas of Improvement:  Learn how to deal with stress and not self-medicate with alcohol  Current Recreation Participation:  Drinking beer, watching hockey, and playing video games  Patient Goal for Hospitalization:  To find out the cause of his drinking and depression and get on medication to stabilize it  [place]  County of Residence:  [place]   Current SI (including self-harm):  No  Current HI:  No  Consent to Intern Participation: N/A   Jacquelynn CreeGreene,Kashish Yglesias M, LRT/CTRS 08/02/2016, 4:14 PM

## 2016-08-02 NOTE — Plan of Care (Signed)
Problem: Safety: Goal: Ability to disclose and discuss suicidal ideas will improve Outcome: Progressing Pt reports no longer suicidal

## 2016-08-02 NOTE — BHH Group Notes (Signed)
BHH Group Notes:  (Nursing/MHT/Case Management/Adjunct)  Date:  08/02/2016  Time:  2:51 PM  Type of Therapy:  Psychoeducational Skills  Participation Level:  None  Participation Quality:  Inattentive  Affect:  Flat  Cognitive:  Appropriate  Insight:  Limited  Engagement in Group:  None  Modes of Intervention:  Discussion and Education  Summary of Progress/Problems:  Patrick [name] 08/02/2016, 2:51 PM

## 2016-08-02 NOTE — Progress Notes (Signed)
Patient irritable this am wanting medications for withdrawal. Denies SI, HI, AVH. Isolates to self and room.  Encouragement and support offered. Safety checks maintained. Medications prescribed. Pt receptive and remains safe on unit with q 15 min checks.

## 2016-08-02 NOTE — [facility] Suicide Risk Assessment (Signed)
[facility] Admission Suicide Risk Assessment   Nursing information obtained from:  Patient Demographic factors:  Male, Adolescent or young adult, Caucasian, Low socioeconomic status, Unemployed Current Mental Status:  Suicidal ideation indicated by others Loss Factors:  Financial problems / change in socioeconomic status Historical Factors:  Prior suicide attempts Risk Reduction Factors:  Responsible for children under 38 years of age, Sense of responsibility to family  Total Time spent with patient: 1 hour Principal Problem: Schizoaffective disorder, depressive type (HCC) Diagnosis:   Patient Active Problem List   Diagnosis Date Noted  . Noncompliance [Z91.19] 08/02/2016  . Suicidal ideation [R45.851] 08/02/2016  . Tobacco use disorder [F17.200] 08/02/2016  . Schizoaffective disorder, depressive type (HCC) [F25.1] 08/01/2016  . Cannabis use disorder, moderate, dependence (HCC) [F12.20] 08/01/2016  . Alcohol use disorder, moderate, dependence (HCC) [F10.20] 08/01/2016   Subjective Data: Suicidal ideation.  Continued Clinical Symptoms:  Alcohol Use Disorder Identification Test Final Score (AUDIT): 37 The "Alcohol Use Disorders Identification Test", Guidelines for Use in Primary Care, Second Edition.  World Science writer [facility]). Score between 0-7:  no or low risk or alcohol related problems. Score between 8-15:  moderate risk of alcohol related problems. Score between 16-19:  high risk of alcohol related problems. Score 20 or above:  warrants further diagnostic evaluation for alcohol dependence and treatment.   CLINICAL FACTORS:   Bipolar Disorder:   Depressive phase Schizophrenia:   Depressive state Less than 78 years old   Musculoskeletal: Strength & Muscle Tone: within normal limits Gait & Station: normal Patient leans: N/A  Psychiatric Specialty Exam: Physical Exam  Nursing note and vitals reviewed. Psychiatric: His speech is normal. His mood appears anxious. His affect  is labile. He is hyperactive. Cognition and memory are normal. He expresses impulsivity. He exhibits a depressed mood. He expresses suicidal ideation. He expresses suicidal plans.    Review of Systems  Neurological: Positive for tremors.  Psychiatric/Behavioral: Positive for depression, substance abuse and suicidal ideas. The patient is nervous/anxious and has insomnia.   All other systems reviewed and are negative.   Blood pressure 122/83, pulse (!) 54, temperature 98.7 F (37.1 C), temperature source Oral, resp. rate 18, height 5\' 10"  (1.778 m), weight 80.7 kg (178 lb), SpO2 100 %.Body mass index is 25.54 kg/m.  General Appearance: Casual  Eye Contact:  Good  Speech:  Clear and Coherent  Volume:  Normal  Mood:  Dysphoric and Irritable  Affect:  Congruent  Thought Process:  Goal Directed and Descriptions of Associations: Intact  Orientation:  Full (Time, Place, and Person)  Thought Content:  WDL  Suicidal Thoughts:  Yes.  with intent/plan  Homicidal Thoughts:  No  Memory:  Immediate;   Fair Recent;   Fair Remote;   Fair  Judgement:  Poor  Insight:  Lacking  Psychomotor Activity:  Increased  Concentration:  Concentration: Fair and Attention Span: Fair  Recall:  [employer] of Knowledge:  Fair  Language:  Fair  Akathisia:  No  Handed:  Right  AIMS (if indicated):     Assets:  Communication Skills Desire for Improvement Financial Resources/Insurance Housing Intimacy Physical Health Resilience Social Support Transportation  ADL's:  Intact  Cognition:  WNL  Sleep:  Number of Hours: 4      COGNITIVE FEATURES THAT CONTRIBUTE TO RISK:  None    SUICIDE RISK:   Severe:  Frequent, intense, and enduring suicidal ideation, specific plan, no subjective intent, but some objective markers of intent (i.e., choice of lethal method), the  method is accessible, some limited preparatory behavior, evidence of impaired self-control, severe dysphoria/symptomatology, multiple risk factors  present, and few if any protective factors, particularly a lack of social support.  PLAN OF CARE: Hospital admission, medication management, alcohol detox, substance abuse counseling, discharge planning.  Mr. Lindie SpruceWyatt is a 31 year old male with history of depression, anxiety, mood instability and alcoholism admitted for suicidal ideation with a plan to cut himself in the context of treatment noncompliance and drinking.  1. Suicidal ideation. The patient is able to contract for safety in the hospital.  2. Mood. She was started on Seroquel in the emergency room but prefers to Zyprexa. We will also start lithium for further stabilization.  3. Alcohol detox. We started lithium taper.  4. Insomnia. This will be addressed with Seroquel.  5. Substance abuse treatment. The patient is not interested in pharmacological treatment of alcoholism and is ambivalent about residential treatment.  6. Smoking. Nicotine patch is available.  7. Metabolic syndrome monitoring. Lipid panel, TSH, hemoglobin A1c are pending.  8. EKG. Pending.  9. Disposition. He will be discharged to home with family. He will follow up with [facility].         I certify that inpatient services furnished can reasonably be expected to improve the patient's condition.   Kristine LineaJolanta Zadok Holaway, MD 08/02/2016, 12:13 PM

## 2016-08-02 NOTE — Progress Notes (Signed)
Recreation Therapy Notes  Date: 05.15.18 Time: 9:30 am Location: Craft Room  Group Topic: Self-expression  Goal Area(s) Addresses:  Patient will effectively use art as a means of self-expression. Patient will recognize positive benefit of self-expression. Patient will be able to identify one emotion experienced during group session. Patient will identify use of art as a coping skill.  Behavioral Response: Did not attend  Intervention: Two Faces of Me  Activity: Patients were given blank face worksheets and were instructed to draw a line down the middle. On one side of the worksheet, patients were instructed to draw or write how they felt when they were admitted to the hospital. On the other side, patients were instructed to draw or write how they want to feel when they are d/c.  Education: LRT educated patients on other forms of self-expression.  Education Outcome: Patient did not attend group.   Clinical Observations/Feedback: Patient did not attend group.  Gilverto Dileonardo M, LRT/CTRS 08/02/2016 10:09 AM 

## 2016-08-02 NOTE — Social Work (Addendum)
CSW left VM requesting call back from Loma SousaBrian McAllister, [facility] who had left message w TTS requesting to be involved in discharge planning due to patient's multiple hospitalizations.  Phone # [phone].  CSW left another message.  Santa GeneraAnne Kannen Moxey, LCSW Lead Clinical Social Worker Phone:  [phone]\

## 2016-08-02 NOTE — H&P (Signed)
Psychiatric Admission Assessment Adult  Patient Identification: Patrick [name] MRN:  [id number] Date of Evaluation:  08/02/2016 Chief Complaint:  Schizoaffective Principal Diagnosis: Schizoaffective disorder, depressive type (HCC) Diagnosis:   Patient Active Problem List   Diagnosis Date Noted  . Noncompliance [Z91.19] 08/02/2016  . Suicidal ideation [R45.851] 08/02/2016  . Tobacco use disorder [F17.200] 08/02/2016  . Alcohol withdrawal (HCC) [F10.239] 08/02/2016  . PTSD (post-traumatic stress disorder) [F43.10] 08/02/2016  . Schizoaffective disorder, depressive type (HCC) [F25.1] 08/01/2016  . Cannabis use disorder, moderate, dependence (HCC) [F12.20] 08/01/2016  . Alcohol use disorder, moderate, dependence (HCC) [F10.20] 08/01/2016   History of Present Illness:   Identifying data. Patrick [name] is a 31 year old male with history of schizoaffective disorder and alcoholism.  Chief complaint. "I'm shaky."  History of present illness. Information was obtained from the patient and the chart. The patient has a long history of depression, anxiety, mood instability and substance use. He was recently hospitalized at [facility] here in the hospital in [place] for suicidal ideation in the context of drinking. He was prescribed Zyprexa at one facility and Seroquel and other. Apparently he has not been taking medications for the past week. He became increasingly depressed with poor sleep, decreased appetite, anhedonia, being of guilt and hopelessness worthlessness, poor energy and concentration, social isolation, crying spells, heightened anxiety and suicidal thinking with a plan to cut himself. This all happened in the context of heavy drinking a case of beer a day. The patient denies psychotic symptoms but reports severe anxiety. He has panic attacks several times a week, nightmares and flashbacks from his so that he suffered 2 years ago, and OCD type of symptoms with excessive worries and organizing. The  patient also reports profound mood swings with periods of hyperactivity, insomnia, talkativeness, poor impulse control. In addition to alcohol his been using marijuana. He denies prescription pill use or other substance use at present.  Past psychiatric history. He suffered severe anxiety since childhood and was in therapy when young. He was diagnosed with schizoaffective disorder and has been treated with multiple medications but never any mood stabilizer like Depakote and lithium or Tegretol. He responded well to Zyprexa and Seroquel in the past. There were at least one suicide attempt by hanging. The patient used to cut, last time 2 years ago. 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 He was treated for substance abuse twice in residential setting but did not maintain sobriety for longer than a month. There is a history of alcohol withdrawal seizures.   Family psychiatric history. Substance abuse on paternal side. He believes that there were family members with undiagnosed mental illness.  Social history. He graduated from high school and went to [employer] on for a year. He supports himself from a trust fund and does not work at present. He lives with his fianc and a 31-month-old baby. His mother lives nearby.  Total Time spent with patient: 1 hour  Is the patient at risk to self? Yes.    Has the patient been a risk to self in the past 6 months? Yes.    Has the patient been a risk to self within the distant past? Yes.    Is the patient a risk to others? No.  Has the patient been a risk to others in the past 6 months? No.  Has the patient been a risk to others within the distant past? No.  Prior Inpatient Therapy:   Prior Outpatient Therapy:    Alcohol Screening: 1. How often do you have a drink containing alcohol?: 4 or more times a week 2. How many drinks containing alcohol do you have on a typical  day when you are drinking?: 10 or more 3. How often do you have six or more drinks on one occasion?: Daily or almost daily Preliminary Score: 8 4. How often during the last year have you found that you were not able to stop drinking once you had started?: Daily or almost daily 5. How often during the last year have you failed to do what was normally expected from you becasue of drinking?: Weekly 6. How often during the last year have you needed a first drink in the morning to get yourself going after a heavy drinking session?: Weekly 7. How often during the last year have you had a feeling of guilt of remorse after drinking?: Weekly 8. How often during the last year have you been unable to remember what happened the night before because you had been drinking?: Daily or almost daily 9. Have you or someone else been injured as a result of your drinking?: Yes, during the last year 10. Has a relative or friend or a doctor or another health worker been concerned about your drinking or suggested you cut down?: Yes, during the last year Alcohol Use Disorder Identification Test Final Score (AUDIT): 37 Brief Intervention: Yes Substance Abuse History in the last 12 months:  No. Consequences of Substance Abuse: Negative Previous Psychotropic Medications: Yes  Psychological Evaluations: No  Past Medical History:  Past Medical History:  Diagnosis Date  . Generalized anxiety disorder   . Major depressive disorder   . Schizoaffective disorder [facility])     Past Surgical History:  Procedure Laterality Date  . SPLENECTOMY     Family History: History reviewed. No pertinent family history.  Tobacco Screening: Have you used any form of tobacco in the last 30 days? (Cigarettes, Smokeless Tobacco, Cigars, and/or Pipes): Yes Tobacco use, Select all that apply: 5 or more cigarettes per day Are you interested in Tobacco Cessation Medications?: Yes, will notify MD for an order Counseled patient on smoking  cessation including recognizing danger situations, developing coping skills and basic information about quitting provided: Yes Social History:  History  Alcohol Use  . Yes    Comment: Occ     History  Drug Use  . Types: Marijuana    Comment: Occ    Additional Social History: Marital status: Long term relationship Long term relationship, how long?: "since we were both 57 - 11 to 12 years" What types of issues is patient dealing with in the relationship?: "we're cool, fine"; "she thinks its for the best that Im in here" Are you sexually active?: Yes What is your sexual orientation?: heterosexual    Pain Medications: None Reported Prescriptions: None Reported Over the Counter: None Reported History of alcohol / drug use?: Yes Longest period of sobriety (when/how long): Unknown Negative Consequences of Use: Financial Withdrawal Symptoms: Agitation, Sweats Name of Substance 1: Marajuana 1 - Age of First Use: Unable to recall 1 - Amount (size/oz): Unable to recall 1 - Frequency: "not all the time" 1 - Duration: UKN 1 - Last Use / Amount: Unable to recall                  Allergies:   Allergies  Allergen Reactions  . Sulfa Antibiotics Anaphylaxis  . Antihistamines, Chlorpheniramine-Type Hives  .  Diphenhydramine Hcl Palpitations   Lab Results:  Results for orders placed or performed during the hospital encounter of 08/02/16 (from the past 48 hour(s))  Lipid panel     Status: Abnormal   Collection Time: 08/02/16  7:15 AM  Result Value Ref Range   Cholesterol 208 (H) 0 - 200 mg/dL   Triglycerides 409 (H) <150 mg/dL   HDL 63 >81 mg/dL   Total CHOL/HDL Ratio 3.3 RATIO   VLDL 31 0 - 40 mg/dL   LDL Cholesterol 191 (H) 0 - 99 mg/dL    Comment:        Total Cholesterol/HDL:CHD Risk Coronary Heart Disease Risk Table                     Men   Women  1/2 Average Risk   3.4   3.3  Average Risk       5.0   4.4  2 X Average Risk   9.6   7.1  3 X Average Risk  23.4   11.0         Use the calculated Patient Ratio above and the CHD Risk Table to determine the patient's CHD Risk.        ATP III CLASSIFICATION (LDL):  <100     mg/dL   Optimal  478-295  mg/dL   Near or Above                    Optimal  130-159  mg/dL   Borderline  621-308  mg/dL   High  >657     mg/dL   Very High   TSH     Status: None   Collection Time: 08/02/16  7:15 AM  Result Value Ref Range   TSH 2.430 0.350 - 4.500 uIU/mL    Comment: Performed by a 3rd Generation assay with a functional sensitivity of <=0.01 uIU/mL.    Blood Alcohol level:  Lab Results  Component Value Date   ETH 94 (H) 07/30/2016    Metabolic Disorder Labs:  No results found for: HGBA1C, MPG No results found for: PROLACTIN Lab Results  Component Value Date   CHOL 208 (H) 08/02/2016   TRIG 153 (H) 08/02/2016   HDL 63 08/02/2016   CHOLHDL 3.3 08/02/2016   VLDL 31 08/02/2016   LDLCALC 114 (H) 08/02/2016    Current Medications: Current Facility-Administered Medications  Medication Dose Route Frequency Provider Last Rate Last Dose  . acetaminophen (TYLENOL) tablet 650 mg  650 mg Oral Q6H PRN Clapacs, John T, MD      . alum & mag hydroxide-simeth (MAALOX/MYLANTA) 200-200-20 MG/5ML suspension 30 mL  30 mL Oral Q4H PRN Clapacs, John T, MD      . chlordiazePOXIDE (LIBRIUM) capsule 50 mg  50 mg Oral QID Hamad Whyte B, MD   50 mg at 08/02/16 1133  . lithium carbonate capsule 300 mg  300 mg Oral TID WC Camilia Caywood B, MD   300 mg at 08/02/16 1133  . magnesium hydroxide (MILK OF MAGNESIA) suspension 30 mL  30 mL Oral Daily PRN Clapacs, John T, MD      . nicotine (NICODERM CQ - dosed in mg/24 hours) patch 21 mg  21 mg Transdermal Daily Kearston Putman B, MD   21 mg at 08/02/16 1134  . OLANZapine (ZYPREXA) tablet 10 mg  10 mg Oral Daily Swetha Rayle B, MD   10 mg at 08/02/16 1133  . QUEtiapine (SEROQUEL) tablet 300 mg  300 mg Oral QHS Clapacs, Jackquline Denmark, MD       PTA  Medications: Prescriptions Prior to Admission  Medication Sig Dispense Refill Last Dose  . buprenorphine-naloxone (SUBOXONE) 8-2 mg SUBL SL tablet Place 2 tablets under the tongue daily.   08/01/2016 at Unknown time  . sertraline (ZOLOFT) 100 MG tablet Take 100 mg by mouth daily.   08/01/2016 at Unknown time  . OLANZapine (ZYPREXA) 10 MG tablet Take 10 mg by mouth at bedtime.   Not Taking at Unknown time  . OLANZapine (ZYPREXA) 5 MG tablet Take 5 mg by mouth daily.   Not Taking at Unknown time    Musculoskeletal: Strength & Muscle Tone: within normal limits Gait & Station: normal Patient leans: Backward  Psychiatric Specialty Exam: Physical Exam  Nursing note and vitals reviewed. Constitutional: He is oriented to person, place, and time. He appears well-developed and well-nourished.  HENT:  Head: Atraumatic.  Eyes: Conjunctivae and EOM are normal. Pupils are equal, round, and reactive to light.  Neck: Normal range of motion.  Cardiovascular: Normal rate, regular rhythm and normal heart sounds.   Respiratory: Effort normal and breath sounds normal.  GI: Soft. Bowel sounds are normal.  Musculoskeletal: Normal range of motion.  Neurological: He is alert and oriented to person, place, and time.  Skin: Skin is warm and dry.  Psychiatric: His speech is normal. His mood appears anxious. He is hyperactive. Cognition and memory are normal. He expresses impulsivity. He exhibits a depressed mood. He expresses suicidal ideation. He expresses suicidal plans.    Review of Systems  Neurological: Positive for tremors.  Psychiatric/Behavioral: Positive for depression, substance abuse and suicidal ideas. The patient is nervous/anxious and has insomnia.   All other systems reviewed and are negative.   Blood pressure 122/83, pulse (!) 54, temperature 98.7 F (37.1 C), temperature source Oral, resp. rate 18, height 5\' 10"  (1.778 m), weight 80.7 kg (178 lb), SpO2 100 %.Body mass index is 25.54 kg/m.   See SRA>                                                  Sleep:  Number of Hours: 4    Treatment Plan Summary: Daily contact with patient to assess and evaluate symptoms and progress in treatment and Medication management   Mr. Ainsley is a 31 year old male with history of depression, anxiety, mood instability and alcoholism admitted for suicidal ideation with a plan to cut himself in the context of treatment noncompliance and drinking.  1. Suicidal ideation. The patient is able to contract for safety in the hospital.  2. Mood. She was started on Seroquel in the emergency room but prefers to Zyprexa. We will also start lithium for further stabilization.  3. Alcohol detox. We started lithium taper.  4. Insomnia. This will be addressed with Seroquel.  5. Substance abuse treatment. The patient is not interested in pharmacological treatment of alcoholism and is ambivalent about residential treatment.  6. Smoking. Nicotine patch is available.  7. Metabolic syndrome monitoring. Lipid panel, TSH, hemoglobin A1c are pending.  8. EKG. Pending.  9. Disposition. He will be discharged to home with family. He will follow up with [facility].   Observation Level/Precautions:  15 minute checks  Laboratory:  CBC Chemistry Profile UDS UA  Psychotherapy:    Medications:    Consultations:  Discharge Concerns:    Estimated LOS:  Other:     Physician Treatment Plan for Primary Diagnosis: Schizoaffective disorder, depressive type (HCC) Long Term Goal(s): Improvement in symptoms so as ready for discharge  Short Term Goals: Ability to identify changes in lifestyle to reduce recurrence of condition will improve, Ability to verbalize feelings will improve, Ability to disclose and discuss suicidal ideas, Ability to demonstrate self-control will improve, Ability to identify and develop effective coping behaviors will improve, Ability to maintain clinical measurements within normal  limits will improve and Ability to identify triggers associated with substance abuse/mental health issues will improve  Physician Treatment Plan for Secondary Diagnosis: Principal Problem:   Schizoaffective disorder, depressive type (HCC) Active Problems:   Cannabis use disorder, moderate, dependence (HCC)   Alcohol use disorder, moderate, dependence (HCC)   Noncompliance   Suicidal ideation   Tobacco use disorder   Alcohol withdrawal (HCC)   PTSD (post-traumatic stress disorder)  Long Term Goal(s): Improvement in symptoms so as ready for discharge  Short Term Goals: Ability to identify changes in lifestyle to reduce recurrence of condition will improve, Ability to demonstrate self-control will improve and Ability to identify triggers associated with substance abuse/mental health issues will improve  I certify that inpatient services furnished can reasonably be expected to improve the patient's condition.    Kristine LineaJolanta Delight Bickle, MD 5/15/201812:32 PM

## 2016-08-02 NOTE — [facility] Group Notes (Signed)
[facility] LCSW Group Therapy Note  Date/Time: 08/02/16, 1500  Type of Therapy/Topic:  Group Therapy:  Feelings about Diagnosis  Participation Level:  Did Not Attend   Mood:   Description of Group:    This group will allow patients to explore their thoughts and feelings about diagnoses they have received. Patients will be guided to explore their level of understanding and acceptance of these diagnoses. Facilitator will encourage patients to process their thoughts and feelings about the reactions of others to their diagnosis, and will guide patients in identifying ways to discuss their diagnosis with significant others in their lives. This group will be process-oriented, with patients participating in exploration of their own experiences as well as giving and receiving support and challenge from other group members.   Therapeutic Goals: 1. Patient will demonstrate understanding of diagnosis as evidence by identifying two or more symptoms of the disorder:  2. Patient will be able to express two feelings regarding the diagnosis 3. Patient will demonstrate ability to communicate their needs through discussion and/or role plays  Summary of Patient Progress:        Therapeutic Modalities:   Cognitive Behavioral Therapy Brief Therapy Feelings Identification   Greg Lakeysha Slutsky, LCSW 

## 2016-08-02 NOTE — Plan of Care (Signed)
Problem: Activity: Goal: Interest or engagement in leisure activities will improve Outcome: Progressing Pt isolates to self and room. Minimal interaction with staff and peers

## 2016-08-02 NOTE — Progress Notes (Signed)
Admission Note   Pt is a 31 y/o male [facility]. On arrival, Pt was a little sleepy however, was oriented x4. Pt at the time of admission endorses moderate anxiety, depression and some agitations; states, "my depression is getting worse, I am very anxious and agitated." Pt at the time denied pain, SI, HI or AVH; states, "I am not hearing any voices and I am not suicidal at this moment." Pt contracts for safety. Pt also admitted to using THC and alcohol; states, "I will need help with my drinking, my depression and the voices." Support, encouragement, and safe environment provided.  15-minute safety checks initiated and continued. Pt remained calm and cooperative through the admission process. Pt searched and no contrabands found.

## 2016-08-02 NOTE — Progress Notes (Signed)
ekg completed

## 2016-08-02 NOTE — ED Notes (Signed)
Report called to [employer], RN

## 2016-08-03 LAB — PROLACTIN: PROLACTIN: 21 ng/mL — AB (ref 4.0–15.2)

## 2016-08-03 LAB — HEMOGLOBIN A1C
Hgb A1c MFr Bld: 5 % (ref 4.8–5.6)
Mean Plasma Glucose: 97 mg/dL

## 2016-08-03 NOTE — Plan of Care (Signed)
Problem: Activity: Goal: Sleeping patterns will improve Outcome: Progressing Patient slept for Estimated Hours of 6.30; every 15 minutes safety round maintained, no injury or falls during this shift.    

## 2016-08-03 NOTE — [facility] Suicide Risk Assessment (Signed)
[facility] INPATIENT:  Family/Significant Other Suicide Prevention Education  Suicide Prevention Education:  Education Completed;Jaime Kiser (signficant other) has been identified by the patient as the family member/significant other with whom the patient will be residing, and identified as the person(s) who will aid the patient in the event of a mental health crisis (suicidal ideations/suicide attempt).  With written consent from the patient, the family member/significant other has been provided the following suicide prevention education, prior to the and/or following the discharge of the patient.  The suicide prevention education provided includes the following:  Suicide risk factors  Suicide prevention and interventions  National Suicide Hotline telephone number  [facility] telephone number  [facility] 911  [facility] and/or [facility] telephone number  Request made of family/significant other to:  Remove weapons (e.g., guns, rifles, knives), all items previously/currently identified as safety concern.    Remove drugs/medications (over-the-counter, prescriptions, illicit drugs), all items previously/currently identified as a safety concern.  The family member/significant other verbalizes understanding of the suicide prevention education information provided.  The family member/significant other agrees to remove the items of safety concern listed above.   Sempra EnergyCandace L Zaydin Billey MSW, LCSWA  08/03/2016, 3:27 PM

## 2016-08-03 NOTE — Progress Notes (Signed)
Patient ID: Patrick [name], male   DOB: May 22, 1985, 31 y.o.   MRN: [id number] Observed pacing (non-aggressive) at the start of shift, requested to see me; "when is my next Librium, I am withdrawing, I drink 24 cans of beer everyday, I drink until I sleep and wake up to drink .Marland Kitchen.I don't have to work, I live off of a [employer]..." Agreed to idling life styles making it possible to relapse on Substances/Etoh, encouragement and support provided; Tylenol 650 mg PRN given for 7/10 toothache with partial relief on re-assessment; EKG obtained as ordered, denied SI/HI/AVH.

## 2016-08-03 NOTE — [facility] Group Notes (Signed)
[facility] Group Notes:  (Nursing/MHT/Case Management/Adjunct)  Date:  08/03/2016  Time:  4:14 PM  Type of Therapy:  Psychoeducational Skills  Participation Level:  Minimal  Participation Quality:  Attentive  Affect:  Resistant  Cognitive:  Lacking  Insight:  Limited  Engagement in Group:  Lacking  Modes of Intervention:  Discussion and Education  Summary of Progress/Problems:  Patrick [name] 08/03/2016, 4:14 PM

## 2016-08-03 NOTE — Tx Team (Signed)
Interdisciplinary Treatment and Diagnostic Plan Update  08/03/2016 Time of Session: 9:00am Patrick LarsenWyatt [name] MRN: [id number]  Principal Diagnosis: Schizoaffective disorder, depressive type (HCC)  Secondary Diagnoses: Principal Problem:   Schizoaffective disorder, depressive type (HCC) Active Problems:   Cannabis use disorder, moderate, dependence (HCC)   Alcohol use disorder, moderate, dependence (HCC)   Noncompliance   Suicidal ideation   Tobacco use disorder   Alcohol withdrawal (HCC)   PTSD (post-traumatic stress disorder)   Current Medications:  Current Facility-Administered Medications  Medication Dose Route Frequency Provider Last Rate Last Dose  . acetaminophen (TYLENOL) tablet 650 mg  650 mg Oral Q6H PRN Clapacs, Jackquline DenmarkJohn T, MD   650 mg at 08/03/16 0734  . alum & mag hydroxide-simeth (MAALOX/MYLANTA) 200-200-20 MG/5ML suspension 30 mL  30 mL Oral Q4H PRN Clapacs, John T, MD      . chlordiazePOXIDE (LIBRIUM) capsule 50 mg  50 mg Oral QID Pucilowska, Jolanta B, MD   50 mg at 08/03/16 1138  . lithium carbonate capsule 300 mg  300 mg Oral TID WC Pucilowska, Jolanta B, MD   300 mg at 08/02/16 1648  . magnesium hydroxide (MILK OF MAGNESIA) suspension 30 mL  30 mL Oral Daily PRN Clapacs, John T, MD      . nicotine (NICODERM CQ - dosed in mg/24 hours) patch 21 mg  21 mg Transdermal Daily Pucilowska, Jolanta B, MD   21 mg at 08/03/16 0733  . OLANZapine (ZYPREXA) tablet 10 mg  10 mg Oral Daily Pucilowska, Jolanta B, MD   10 mg at 08/03/16 0733  . QUEtiapine (SEROQUEL) tablet 300 mg  300 mg Oral QHS Clapacs, Jackquline DenmarkJohn T, MD   300 mg at 08/02/16 2112   PTA Medications: Prescriptions Prior to Admission  Medication Sig Dispense Refill Last Dose  . buprenorphine-naloxone (SUBOXONE) 8-2 mg SUBL SL tablet Place 2 tablets under the tongue daily.   08/01/2016 at Unknown time  . sertraline (ZOLOFT) 100 MG tablet Take 100 mg by mouth daily.   08/01/2016 at Unknown time  . OLANZapine (ZYPREXA) 10 MG tablet  Take 10 mg by mouth at bedtime.   Not Taking at Unknown time  . OLANZapine (ZYPREXA) 5 MG tablet Take 5 mg by mouth daily.   Not Taking at Unknown time    Patient Stressors: Financial difficulties Marital or family conflict Medication change or noncompliance Substance abuse  Patient Strengths: Capable of independent living Manufacturing systems engineerCommunication skills Physical Health Supportive family/friends  Treatment Modalities: Medication Management, Group therapy, Case management,  1 to 1 session with clinician, Psychoeducation, Recreational therapy.   Physician Treatment Plan for Primary Diagnosis: Schizoaffective disorder, depressive type (HCC) Long Term Goal(s): Improvement in symptoms so as ready for discharge Improvement in symptoms so as ready for discharge   Short Term Goals: Ability to identify changes in lifestyle to reduce recurrence of condition will improve Ability to verbalize feelings will improve Ability to disclose and discuss suicidal ideas Ability to demonstrate self-control will improve Ability to identify and develop effective coping behaviors will improve Ability to maintain clinical measurements within normal limits will improve Ability to identify triggers associated with substance abuse/mental health issues will improve Ability to identify changes in lifestyle to reduce recurrence of condition will improve Ability to demonstrate self-control will improve Ability to identify triggers associated with substance abuse/mental health issues will improve  Medication Management: Evaluate patient's response, side effects, and tolerance of medication regimen.  Therapeutic Interventions: 1 to 1 sessions, Unit Group sessions and Medication administration.  Evaluation of Outcomes:  Progressing  Physician Treatment Plan for Secondary Diagnosis: Principal Problem:   Schizoaffective disorder, depressive type (HCC) Active Problems:   Cannabis use disorder, moderate, dependence (HCC)    Alcohol use disorder, moderate, dependence (HCC)   Noncompliance   Suicidal ideation   Tobacco use disorder   Alcohol withdrawal (HCC)   PTSD (post-traumatic stress disorder)  Long Term Goal(s): Improvement in symptoms so as ready for discharge Improvement in symptoms so as ready for discharge   Short Term Goals: Ability to identify changes in lifestyle to reduce recurrence of condition will improve Ability to verbalize feelings will improve Ability to disclose and discuss suicidal ideas Ability to demonstrate self-control will improve Ability to identify and develop effective coping behaviors will improve Ability to maintain clinical measurements within normal limits will improve Ability to identify triggers associated with substance abuse/mental health issues will improve Ability to identify changes in lifestyle to reduce recurrence of condition will improve Ability to demonstrate self-control will improve Ability to identify triggers associated with substance abuse/mental health issues will improve     Medication Management: Evaluate patient's response, side effects, and tolerance of medication regimen.  Therapeutic Interventions: 1 to 1 sessions, Unit Group sessions and Medication administration.  Evaluation of Outcomes: Progressing   RN Treatment Plan for Primary Diagnosis: Schizoaffective disorder, depressive type (HCC) Long Term Goal(s): Knowledge of disease and therapeutic regimen to maintain health will improve  Short Term Goals: Ability to participate in decision making will improve, Ability to identify and develop effective coping behaviors will improve and Compliance with prescribed medications will improve  Medication Management: RN will administer medications as ordered by provider, will assess and evaluate patient's response and provide education to patient for prescribed medication. RN will report any adverse and/or side effects to prescribing provider.  Therapeutic  Interventions: 1 on 1 counseling sessions, Psychoeducation, Medication administration, Evaluate responses to treatment, Monitor vital signs and CBGs as ordered, Perform/monitor CIWA, COWS, AIMS and Fall Risk screenings as ordered, Perform wound care treatments as ordered.  Evaluation of Outcomes: Progressing   LCSW Treatment Plan for Primary Diagnosis: Schizoaffective disorder, depressive type (HCC) Long Term Goal(s): Safe transition to appropriate next level of care at discharge, Engage patient in therapeutic group addressing interpersonal concerns.  Short Term Goals: Engage patient in aftercare planning with referrals and resources, Increase social support, Increase ability to appropriately verbalize feelings, Increase emotional regulation, Identify triggers associated with mental health/substance abuse issues and Increase skills for wellness and recovery  Therapeutic Interventions: Assess for all discharge needs, 1 to 1 time with Social worker, Explore available resources and support systems, Assess for adequacy in community support network, Educate family and significant other(s) on suicide prevention, Complete Psychosocial Assessment, Interpersonal group therapy.  Evaluation of Outcomes: Progressing   Progress in Treatment: Attending groups: Yes. Participating in groups: Yes. Taking medication as prescribed: Yes. Toleration medication: Yes. Family/Significant other contact made: No, will contact:  CSW assessing  Patient understands diagnosis: No. and As evidenced by:  Limited insight  Discussing patient identified problems/goals with staff: Yes. Medical problems stabilized or resolved: Yes. Denies suicidal/homicidal ideation: Yes. Issues/concerns per patient self-inventory: No. Other: NA   New problem(s) identified: No, Describe:  NA  New Short Term/Long Term Goal(s):  Discharge Plan or Barriers: Pt plans to return home and follow up with outpatient.    Reason for Continuation  of Hospitalization: Anxiety Depression Medication stabilization Suicidal ideation Withdrawal symptoms  Estimated Length of Stay: 3- 5 days   Attendees: Patient: 08/03/2016 11:45 AM  Physician:  Dr. Jennet Maduro  08/03/2016 11:45 AM  Nursing: Dedra Skeens, RN  08/03/2016 11:45 AM  RN Care Manager: 08/03/2016 11:45 AM  Social Worker: Daisy Floro Vaiden, LCSWA 08/03/2016 11:45 AM  Recreational Therapist: Hershal Coria, LRT  08/03/2016 11:45 AM  Other:  08/03/2016 11:45 AM  Other:  08/03/2016 11:45 AM  Other: 08/03/2016 11:45 AM    Scribe for Treatment Team: Rondall Allegra, LCSWA 08/03/2016 11:45 AM

## 2016-08-03 NOTE — Progress Notes (Signed)
Recreation Therapy Notes  Date: 05.16.18 Time: 9:30 am Location: Craft Room  Group Topic: Self-esteem  Goal Area(s) Addresses:  Patient will write at least one positive trait about self. Patient will verbalize benefit of having a healthy self-esteem.  Behavioral Response: Intermittently Attentive, Interactive  Intervention: I Am  Activity: Patients were given a worksheet with the letter I on it and were instructed to write as many positive traits about themselves inside the letter.  Education: LRT educated patients on ways they can increase their self-esteem.  Education Outcome: In group clarification offered   Clinical Observations/Feedback: Patient wrote positive traits about self. Patient contributed to group discussion by stating it was easy to think of positive traits and why. Patient was fidgety throughout group playing with markers. LRT had to redirect patient a couple times.  Jacquelynn CreeGreene,Hafiz Irion M, LRT/CTRS 08/03/2016 10:28 AM

## 2016-08-03 NOTE — [facility] Group Notes (Signed)
  [facility] LCSW Group Therapy Note  Date/Time5/16/18, 1300  Type of Therapy/Topic:  Group Therapy:  Emotion Regulation  Participation Level:  Minimal   Mood:pleasant  Description of Group:    The purpose of this group is to assist patients in learning to regulate negative emotions and experience positive emotions. Patients will be guided to discuss ways in which they have been vulnerable to their negative emotions. These vulnerabilities will be juxtaposed with experiences of positive emotions or situations, and patients challenged to use positive emotions to combat negative ones. Special emphasis will be placed on coping with negative emotions in conflict situations, and patients will process healthy conflict resolution skills.  Therapeutic Goals: 1. Patient will identify two positive emotions or experiences to reflect on in order to balance out negative emotions:  2. Patient will label two or more emotions that they find the most difficult to experience:  3. Patient will be able to demonstrate positive conflict resolution skills through discussion or role plays:   Summary of Patient Progress:Pt identified anxiety as an emotion that is difficult for him and took part in discussion about ways to interrupt the emotion cycle.  Pt also identified spending time with his child as one way he can keep his emotions in check as this calms him.         Therapeutic Modalities:   Cognitive Behavioral Therapy Feelings Identification Dialectical Behavioral Therapy  Daleen SquibbGreg Draeden Kellman, LCSW

## 2016-08-03 NOTE — Plan of Care (Signed)
Problem: Coping: Goal: Ability to verbalize feelings will improve Outcome: Progressing Patient able to discuss feelings about care and discuss alcohol habits

## 2016-08-03 NOTE — Progress Notes (Signed)
Patient continues to complain of withdrawal symptoms, request librium earlier than scheduled. Denies SI, HI, AVH. Medication and group compliant. Appropriate with staff and peers.  Encouragement and support offered. Safety checks maintained. Medication given as prescribed. Pt receptive and remains safe on unit with q 15 min checks.

## 2016-08-03 NOTE — Progress Notes (Signed)
[facility] MD Progress Note  08/03/2016 9:50 AM Patrick [name]  MRN:  [id number]  Subjective:    08/03/2016. Patrick [name] feels physically much better today. He is not shaky or sweaty and seems to tolerate Librium detox well. He is still irritable and dysphoric. He was started on lithium, Zyprexa, and Seroquel. He accepts medications and seems to tolerate them well. He spent time with Unk Pinto talking about substance abuse treatment. He is not interested in residential treatment and marginally interested in SA IOP. Limited group participation.  Per nursing: Observed pacing (non-aggressive) at the start of shift, requested to see me; "when is my next Librium, I am withdrawing, I drink 24 cans of beer everyday, I drink until I sleep and wake up to drink .Marland KitchenI don't have to work, I live off of a [employer]..." Agreed to idling life styles making it possible to relapse on Substances/Etoh, encouragement and support provided; Tylenol 650 mg PRN given for 7/10 toothache with partial relief on re-assessment; EKG obtained as ordered, denied SI/HI/AVH.  Principal Problem: Schizoaffective disorder, depressive type (HCC) Diagnosis:   Patient Active Problem List   Diagnosis Date Noted  . Noncompliance [Z91.19] 08/02/2016  . Suicidal ideation [R45.851] 08/02/2016  . Tobacco use disorder [F17.200] 08/02/2016  . Alcohol withdrawal (HCC) [F10.239] 08/02/2016  . PTSD (post-traumatic stress disorder) [F43.10] 08/02/2016  . Schizoaffective disorder, depressive type (HCC) [F25.1] 08/01/2016  . Cannabis use disorder, moderate, dependence (HCC) [F12.20] 08/01/2016  . Alcohol use disorder, moderate, dependence (HCC) [F10.20] 08/01/2016   Total Time spent with patient: 30 minutes  Past Psychiatric History: schizoaffective disorder, alcoholism.  Past Medical History:  Past Medical History:  Diagnosis Date  . Generalized anxiety disorder   . Major depressive disorder   . Schizoaffective disorder [facility])     Past  Surgical History:  Procedure Laterality Date  . SPLENECTOMY     Family History: History reviewed. No pertinent family history. Family Psychiatric  History: substance use. Social History:  History  Alcohol Use  . Yes    Comment: Occ     History  Drug Use  . Types: Marijuana    Comment: Occ    Social History   Social History  . Marital status: Single    Spouse name: N/A  . Number of children: N/A  . Years of education: N/A   Social History Main Topics  . Smoking status: Current Every Day Smoker    Packs/day: 1.00    Types: Cigarettes  . Smokeless tobacco: Never Used  . Alcohol use Yes     Comment: Occ  . Drug use: Yes    Types: Marijuana     Comment: Occ  . Sexual activity: Not Asked   Other Topics Concern  . None   Social History Narrative  . None   Additional Social History:    Pain Medications: None Reported Prescriptions: None Reported Over the Counter: None Reported History of alcohol / drug use?: Yes Longest period of sobriety (when/how long): Unknown Negative Consequences of Use: Financial Withdrawal Symptoms: Agitation, Sweats Name of Substance 1: Marajuana 1 - Age of First Use: Unable to recall 1 - Amount (size/oz): Unable to recall 1 - Frequency: "not all the time" 1 - Duration: UKN 1 - Last Use / Amount: Unable to recall                  Sleep: Poor  Appetite:  Fair  Current Medications: Current Facility-Administered Medications  Medication Dose Route Frequency Provider Last Rate  Last Dose  . acetaminophen (TYLENOL) tablet 650 mg  650 mg Oral Q6H PRN Clapacs, Jackquline Denmark, MD   650 mg at 08/03/16 0734  . alum & mag hydroxide-simeth (MAALOX/MYLANTA) 200-200-20 MG/5ML suspension 30 mL  30 mL Oral Q4H PRN Clapacs, John T, MD      . chlordiazePOXIDE (LIBRIUM) capsule 50 mg  50 mg Oral QID Leopoldo Mazzie B, MD   50 mg at 08/03/16 0734  . lithium carbonate capsule 300 mg  300 mg Oral TID WC Christinna Sprung B, MD   300 mg at 08/02/16  1648  . magnesium hydroxide (MILK OF MAGNESIA) suspension 30 mL  30 mL Oral Daily PRN Clapacs, John T, MD      . nicotine (NICODERM CQ - dosed in mg/24 hours) patch 21 mg  21 mg Transdermal Daily Zula Hovsepian B, MD   21 mg at 08/03/16 0733  . OLANZapine (ZYPREXA) tablet 10 mg  10 mg Oral Daily Donalee Gaumond B, MD   10 mg at 08/03/16 0733  . QUEtiapine (SEROQUEL) tablet 300 mg  300 mg Oral QHS Clapacs, Jackquline Denmark, MD   300 mg at 08/02/16 2112    Lab Results:  Results for orders placed or performed during the hospital encounter of 08/02/16 (from the past 48 hour(s))  Hemoglobin A1c     Status: None   Collection Time: 08/02/16  7:15 AM  Result Value Ref Range   Hgb A1c MFr Bld 5.0 4.8 - 5.6 %    Comment: (NOTE)         Pre-diabetes: 5.7 - 6.4         Diabetes: >6.4         Glycemic control for adults with diabetes: <7.0    Mean Plasma Glucose 97 mg/dL    Comment: (NOTE) Performed At: [facility] [address] [place], [place] [id number] Mila Homer MD [id number]   Lipid panel     Status: Abnormal   Collection Time: 08/02/16  7:15 AM  Result Value Ref Range   Cholesterol 208 (H) 0 - 200 mg/dL   Triglycerides 629 (H) <150 mg/dL   HDL 63 >52 mg/dL   Total CHOL/HDL Ratio 3.3 RATIO   VLDL 31 0 - 40 mg/dL   LDL Cholesterol 841 (H) 0 - 99 mg/dL    Comment:        Total Cholesterol/HDL:CHD Risk Coronary Heart Disease Risk Table                     Men   Women  1/2 Average Risk   3.4   3.3  Average Risk       5.0   4.4  2 X Average Risk   9.6   7.1  3 X Average Risk  23.4   11.0        Use the calculated Patient Ratio above and the CHD Risk Table to determine the patient's CHD Risk.        ATP III CLASSIFICATION (LDL):  <100     mg/dL   Optimal  324-401  mg/dL   Near or Above                    Optimal  130-159  mg/dL   Borderline  027-253  mg/dL   High  >664     mg/dL   Very High   Prolactin     Status: Abnormal   Collection Time: 08/02/16  7:15  AM  Result Value Ref Range   Prolactin 21.0 (H) 4.0 - 15.2 ng/mL    Comment: (NOTE) Performed At: [facility] [address] [place], [place] [id number] Mila HomerHancock William F MD WU:[id number]:[phone]   TSH     Status: None   Collection Time: 08/02/16  7:15 AM  Result Value Ref Range   TSH 2.430 0.350 - 4.500 uIU/mL    Comment: Performed by a 3rd Generation assay with a functional sensitivity of <=0.01 uIU/mL.    Blood Alcohol level:  Lab Results  Component Value Date   ETH 94 (H) 07/30/2016    Metabolic Disorder Labs: Lab Results  Component Value Date   HGBA1C 5.0 08/02/2016   MPG 97 08/02/2016   Lab Results  Component Value Date   PROLACTIN 21.0 (H) 08/02/2016   Lab Results  Component Value Date   CHOL 208 (H) 08/02/2016   TRIG 153 (H) 08/02/2016   HDL 63 08/02/2016   CHOLHDL 3.3 08/02/2016   VLDL 31 08/02/2016   LDLCALC 114 (H) 08/02/2016    Physical Findings: AIMS: Facial and Oral Movements Muscles of Facial Expression: None, normal Lips and Perioral Area: None, normal Jaw: None, normal Tongue: None, normal,Extremity Movements Upper (arms, wrists, hands, fingers): None, normal Lower (legs, knees, ankles, toes): None, normal, Trunk Movements Neck, shoulders, hips: None, normal, Overall Severity Severity of abnormal movements (highest score from questions above): None, normal Incapacitation due to abnormal movements: None, normal Patient's awareness of abnormal movements (rate only patient's report): No Awareness, Dental Status Current problems with teeth and/or dentures?: No Does patient usually wear dentures?: No  CIWA:    COWS:     Musculoskeletal: Strength & Muscle Tone: within normal limits Gait & Station: normal Patient leans: N/A  Psychiatric Specialty Exam: Physical Exam  Nursing note and vitals reviewed. Psychiatric: His speech is normal. His mood appears anxious. His affect is labile. He is hyperactive. Cognition and memory are normal. He  expresses impulsivity. He exhibits a depressed mood. He expresses suicidal ideation. He expresses suicidal plans.    Review of Systems  Neurological: Positive for tremors.  Psychiatric/Behavioral: Positive for depression, substance abuse and suicidal ideas. The patient is nervous/anxious and has insomnia.   All other systems reviewed and are negative.   Blood pressure 122/69, pulse 66, temperature 97.9 F (36.6 C), temperature source Oral, resp. rate 18, height 5\' 10"  (1.778 m), weight 80.7 kg (178 lb), SpO2 100 %.Body mass index is 25.54 kg/m.  General Appearance: Casual  Eye Contact:  Good  Speech:  Clear and Coherent  Volume:  Normal  Mood:  Anxious, Dysphoric and Irritable  Affect:  Labile  Thought Process:  Goal Directed and Descriptions of Associations: Intact  Orientation:  Full (Time, Place, and Person)  Thought Content:  WDL  Suicidal Thoughts:  Yes.  with intent/plan  Homicidal Thoughts:  No  Memory:  Immediate;   Fair Recent;   Fair Remote;   Fair  Judgement:  Poor  Insight:  Lacking  Psychomotor Activity:  Increased  Concentration:  Concentration: Fair and Attention Span: Fair  Recall:  F[employer]ervFair  Fund of Knowledge:  Fair  Language:  Fair  Akathisia:  No  Handed:  Right  AIMS (if indicated):     Assets:  Communication Skills Desire for Improvement Financial Resources/Insurance Housing Physical Health Resilience Social Support Transportation  ADL's:  Intact  Cognition:  WNL  Sleep:  Number of Hours: 6.3     Treatment Plan Summary: Daily contact with patient to assess and  evaluate symptoms and progress in treatment   Mr. Osualdo is a 31 year old male with history of depression, anxiety, mood instability and alcoholism admitted for suicidal ideation with a plan to cut himself in the context of treatment noncompliance and drinking.  1. Suicidal ideation. The patient is able to contract for safety in the hospital.  2. Mood. He was started on Seroquel in the  emergency room but prefers Zyprexa. We will also start lithium for further stabilization.  3. Alcohol detox. We started librium taper. Vital signs are stable.  4. Insomnia. This was addressed with Seroquel.   5. Substance abuse treatment. The patient is not interested in pharmacological treatment of alcoholism and is ambivalent about residential treatment.  6. Smoking. Nicotine patch is available.  7. Metabolic syndrome monitoring. Lipid panel, TSH and Hemoglobin A1c are normal.   8. EKG. Sinus tachycardia, QTc 428.  9. Disposition. He will be discharged to home with family. He will follow up with [facility].  Kristine Linea, MD 08/03/2016, 9:50 AM

## 2016-08-04 MED ORDER — OLANZAPINE 10 MG PO TABS: 10.0000 mg | ORAL_TABLET | Freq: Every day | ORAL | 11 refills | Status: DC

## 2016-08-04 MED ORDER — LITHIUM CARBONATE 300 MG PO CAPS: 300.0000 mg | ORAL_CAPSULE | Freq: Three times a day (TID) | ORAL | 1 refills | Status: DC

## 2016-08-04 MED ORDER — QUETIAPINE FUMARATE 300 MG PO TABS: 300.0000 mg | ORAL_TABLET | Freq: Every day | ORAL | 1 refills | Status: DC

## 2016-08-04 MED ORDER — BENZOCAINE 10 % MT GEL
Freq: Three times a day (TID) | OROMUCOSAL | Status: DC | PRN
  Administered 2016-08-04: 22:00:00 via OROMUCOSAL
  Filled 2016-08-04 (×2): qty 9.4

## 2016-08-04 MED ORDER — HYDROXYZINE HCL 25 MG PO TABS: 25.0000 mg | ORAL_TABLET | Freq: Once | ORAL | Status: DC

## 2016-08-04 NOTE — Progress Notes (Signed)
Patient ID: Patrick [name], male   DOB: June 13, 1985, 31 y.o.   MRN: [id number]  Patient paced (nonaggressively) up and down the hallway during shift change and was the first patient I talked to; he continues to watch the clock for scheduled Librium; scheduled medications discussed on 1:1 basis, PRN medications discussed, explanations provided "the doctor said I can have Librium every 2 hours as needed..." Medications given as scheduled, received on request prn Tylenol 650 mg for 6/10 toothache.

## 2016-08-04 NOTE — Progress Notes (Signed)
Recreation Therapy Notes  At approximately 3:25 pm, LRT attempted treatment session. Patient was sleeping and would not wake up when name was called.  Jacquelynn CreeGreene,Johndavid Geralds M, LRT/CTRS 08/04/2016 4:07 PM

## 2016-08-04 NOTE — Progress Notes (Signed)
Pt to nurse reporting loud auditory hallucinations of "like 5 people talking at once." Pt pacing hallway. Requesting medication for anxiety/agitation. Per Dr. Daleen Boavi via telephone, give pt once dose of hydroxyzine 25 mg ONCE. Will administer and continue to monitor.

## 2016-08-04 NOTE — [facility] Group Notes (Signed)
[facility] Group Notes:  (Nursing/MHT/Case Management/Adjunct)  Date:  08/04/2016  Time:  5:34 AM  Type of Therapy:  Group Therapy  Participation Level:  Minimal  Participation Quality:  Resistant  Affect:  Irritable  Cognitive:  Alert  Insight:  Limited  Engagement in Group:  Resistant  Modes of Intervention:  Discussion  Summary of Progress/Problems: Pt shared his goal of being discharged as soon as possible. Once he shared his goal staff observed pt sighing heavily and fidgeting in his seat. He begin to [facility] as though he was irritable every time a fellow pt would talk about their goal.   Veva Holesshley Imani Nahmir Zeidman 08/04/2016, 5:34 AM

## 2016-08-04 NOTE — Progress Notes (Signed)
Patient ID: Patrick [name], male   DOB: 12/21/1985, 31 y.o.   MRN: [id number]  CSW met with patient and patient's care coordinator from [facility], Aaron Edelman [phone]. Care coordinator went over with patient several outpatient resources and services that patient could access for additional support in the community including a community support team. Patient was guarded and irritable. Patient refused all services stating, "Man I'm being discharged tomorrow I'm not interested." Care coordinator provided patient with his contact information and informed patient he would try following up with patient once he discharges. Patient then left out the room stating, "I;m being discharged tomorrow."   At this time, patient is refusing an additional community resources for outpatient care besides medication management. Discharge plan is for patient to follow-up with [facility] for outpatient services.   Bharat Antillon G. McAdoo, [place] 08/04/2016 1:53 PM

## 2016-08-04 NOTE — [facility] Group Notes (Signed)
[facility] LCSW Group Therapy   08/04/2016 1pm   Type of Therapy: Group Therapy   Participation Level: Patient invited but did not attend.  Hampton AbbotKadijah Davonda Ausley, MSW, LCSWA 08/04/2016, 2:13PM

## 2016-08-04 NOTE — Progress Notes (Signed)
Pt continues to pace unit. Makes comments to this nurse and other patients about his plan to "go get a beer and cigarette as soon as I leave this place."   Approached this nurse requesting oragel for tooth pain. It is noted that pt has been taking tylenol round the clock as ordered PRN for tooth pain for the past 2 days. Called Dr. Daleen Boavi, received order for oragel. Safety maintained. Will continue to monitor.

## 2016-08-04 NOTE — Plan of Care (Signed)
Problem: Activity: Goal: Sleeping patterns will improve Outcome: Progressing Patient slept for Estimated Hours of 8.15; every 15 minutes safety round maintained, no injury or falls during this shift.    

## 2016-08-04 NOTE — Progress Notes (Signed)
Pt continues approach nurse as soon as medications are due. Medication compliant although he voices concern with taking lithium. Pt spends much of his time in his room, in bed. Minimal interaction with staff/peers observed. Does not attend group. No signs of withdrawal observed. Reports good sleep last night, appetite good. Denies SI/HI/AVH, pain. Pt appears sullen. Cooperative on unit. Support and encouragement provided. Medications administered as ordered with education. Safety maintained with every 15 minute checks. Will continue to monitor.

## 2016-08-04 NOTE — Progress Notes (Signed)
CSW spoke with patient's care coordinator [facility], Arlys JohnBrian [phone]. Stated he would like to come and meet with patient to discuss potential services and resources for patient at discharge.   CSW went to patient and asked if he was agreeable to meeting with his Care Coordinator. Patient is agreeable at this time.  Care Coordinator, Arlys JohnBrian will be here today at 1:00pm to meet with patient. CSW provided update to care coordinator of patient's progress and discharge plan.  English Craighead G. Garnette CzechSampson MSW, [facility] 08/04/2016 11:37 AM

## 2016-08-04 NOTE — Progress Notes (Signed)
Pt voicing concerns with his medications, particularly lithium which he states, "makes me lethargic, tired all the time." Encouraged pt to voice concerns to doctor as well. Safety maintained. Will continue to monitor.

## 2016-08-04 NOTE — Progress Notes (Signed)
[facility] MD Progress Note  08/04/2016 11:05 AM Rayshawn Maney  MRN:  [id number]  Subjective:    08/03/2016. Mr. Roylance feels physically much better today. He is not shaky or sweaty and seems to tolerate Librium detox well. He is still irritable and dysphoric. He was started on lithium, Zyprexa, and Seroquel. He accepts medications and seems to tolerate them well. He spent time with Unk Pinto talking about substance abuse treatment. He is not interested in residential treatment and marginally interested in SA IOP. Limited group participation.  08/04/2016. Mr. Coltrane feels much better physically and mentally. He is no longer sweaty shaky. He is no longer pacing or demanding benzodiazepines. His mood is improving affect is brighter. He is no longer suicidal. He has second thoughts about lithium as he feels that it makes him lethargic. He requests discharge tomorrow to attend the family funeral. Sleep and appetite are good. The patient admitted that he wants the patient at the Suboxone clinic in [place], [place] but no longer obtain services there.  Per nursing: Patient paced (nonaggressively) up and down the hallway during shift change and was the first patient I talked to; he continues to watch the clock for scheduled Librium; scheduled medications discussed on 1:1 basis, PRN medications discussed, explanations provided "the doctor said I can have Librium every 2 hours as needed..." Medications given as scheduled, received on request prn Tylenol 650 mg for 6/10 toothache.  Principal Problem: Schizoaffective disorder, depressive type (HCC) Diagnosis:   Patient Active Problem List   Diagnosis Date Noted  . Noncompliance [Z91.19] 08/02/2016  . Suicidal ideation [R45.851] 08/02/2016  . Tobacco use disorder [F17.200] 08/02/2016  . Alcohol withdrawal (HCC) [F10.239] 08/02/2016  . PTSD (post-traumatic stress disorder) [F43.10] 08/02/2016  . Schizoaffective disorder, depressive type (HCC) [F25.1] 08/01/2016  .  Cannabis use disorder, moderate, dependence (HCC) [F12.20] 08/01/2016  . Alcohol use disorder, moderate, dependence (HCC) [F10.20] 08/01/2016   Total Time spent with patient: 30 minutes  Past Psychiatric History: schizoaffective disorder, alcoholism.  Past Medical History:  Past Medical History:  Diagnosis Date  . Generalized anxiety disorder   . Major depressive disorder   . Schizoaffective disorder [facility])     Past Surgical History:  Procedure Laterality Date  . SPLENECTOMY     Family History: History reviewed. No pertinent family history. Family Psychiatric  History: substance use. Social History:  History  Alcohol Use  . Yes    Comment: Occ     History  Drug Use  . Types: Marijuana    Comment: Occ    Social History   Social History  . Marital status: Single    Spouse name: N/A  . Number of children: N/A  . Years of education: N/A   Social History Main Topics  . Smoking status: Current Every Day Smoker    Packs/day: 1.00    Types: Cigarettes  . Smokeless tobacco: Never Used  . Alcohol use Yes     Comment: Occ  . Drug use: Yes    Types: Marijuana     Comment: Occ  . Sexual activity: Not Asked   Other Topics Concern  . None   Social History Narrative  . None   Additional Social History:    Pain Medications: None Reported Prescriptions: None Reported Over the Counter: None Reported History of alcohol / drug use?: Yes Longest period of sobriety (when/how long): Unknown Negative Consequences of Use: Financial Withdrawal Symptoms: Agitation, Sweats Name of Substance 1: Marajuana 1 - Age of First Use: Unable  to recall 1 - Amount (size/oz): Unable to recall 1 - Frequency: "not all the time" 1 - Duration: UKN 1 - Last Use / Amount: Unable to recall                  Sleep: Poor  Appetite:  Fair  Current Medications: Current Facility-Administered Medications  Medication Dose Route Frequency Provider Last Rate Last Dose  . acetaminophen  (TYLENOL) tablet 650 mg  650 mg Oral Q6H PRN Clapacs, Jackquline Denmark, MD   650 mg at 08/04/16 0752  . alum & mag hydroxide-simeth (MAALOX/MYLANTA) 200-200-20 MG/5ML suspension 30 mL  30 mL Oral Q4H PRN Clapacs, John T, MD      . chlordiazePOXIDE (LIBRIUM) capsule 50 mg  50 mg Oral QID Ladan Vanderzanden B, MD   50 mg at 08/04/16 0752  . lithium carbonate capsule 300 mg  300 mg Oral TID WC Dajuana Palen B, MD   300 mg at 08/04/16 0752  . magnesium hydroxide (MILK OF MAGNESIA) suspension 30 mL  30 mL Oral Daily PRN Clapacs, John T, MD      . nicotine (NICODERM CQ - dosed in mg/24 hours) patch 21 mg  21 mg Transdermal Daily Rayshawn Maney B, MD   21 mg at 08/04/16 0753  . OLANZapine (ZYPREXA) tablet 10 mg  10 mg Oral Daily Analie Katzman B, MD   10 mg at 08/04/16 0752  . QUEtiapine (SEROQUEL) tablet 300 mg  300 mg Oral QHS Clapacs, John T, MD   300 mg at 08/03/16 2108    Lab Results:  No results found for this or any previous visit (from the past 48 hour(s)).  Blood Alcohol level:  Lab Results  Component Value Date   ETH 94 (H) 07/30/2016    Metabolic Disorder Labs: Lab Results  Component Value Date   HGBA1C 5.0 08/02/2016   MPG 97 08/02/2016   Lab Results  Component Value Date   PROLACTIN 21.0 (H) 08/02/2016   Lab Results  Component Value Date   CHOL 208 (H) 08/02/2016   TRIG 153 (H) 08/02/2016   HDL 63 08/02/2016   CHOLHDL 3.3 08/02/2016   VLDL 31 08/02/2016   LDLCALC 114 (H) 08/02/2016    Physical Findings: AIMS: Facial and Oral Movements Muscles of Facial Expression: None, normal Lips and Perioral Area: None, normal Jaw: None, normal Tongue: None, normal,Extremity Movements Upper (arms, wrists, hands, fingers): None, normal Lower (legs, knees, ankles, toes): None, normal, Trunk Movements Neck, shoulders, hips: None, normal, Overall Severity Severity of abnormal movements (highest score from questions above): None, normal Incapacitation due to abnormal  movements: None, normal Patient's awareness of abnormal movements (rate only patient's report): No Awareness, Dental Status Current problems with teeth and/or dentures?: No Does patient usually wear dentures?: No  CIWA:    COWS:     Musculoskeletal: Strength & Muscle Tone: within normal limits Gait & Station: normal Patient leans: N/A  Psychiatric Specialty Exam: Physical Exam  Nursing note and vitals reviewed. Psychiatric: His speech is normal. His mood appears anxious. His affect is labile. He is hyperactive. Cognition and memory are normal. He expresses impulsivity. He exhibits a depressed mood. He expresses suicidal ideation. He expresses suicidal plans.    Review of Systems  Neurological: Positive for tremors.  Psychiatric/Behavioral: Positive for depression, substance abuse and suicidal ideas. The patient is nervous/anxious and has insomnia.   All other systems reviewed and are negative.   Blood pressure 126/67, pulse 79, temperature 97.8 F (36.6 C), resp.  rate 18, height 5\' 10"  (1.778 m), weight 80.7 kg (178 lb), SpO2 100 %.Body mass index is 25.54 kg/m.  General Appearance: Casual  Eye Contact:  Good  Speech:  Clear and Coherent  Volume:  Normal  Mood:  Anxious, Dysphoric and Irritable  Affect:  Labile  Thought Process:  Goal Directed and Descriptions of Associations: Intact  Orientation:  Full (Time, Place, and Person)  Thought Content:  WDL  Suicidal Thoughts:  Yes.  with intent/plan  Homicidal Thoughts:  No  Memory:  Immediate;   Fair Recent;   Fair Remote;   Fair  Judgement:  Poor  Insight:  Lacking  Psychomotor Activity:  Increased  Concentration:  Concentration: Fair and Attention Span: Fair  Recall:  FiservFair  Fund of Knowledge:  Fair  Language:  Fair  Akathisia:  No  Handed:  Right  AIMS (if indicated):     Assets:  Communication Skills Desire for Improvement Financial Resources/Insurance Housing Physical Health Resilience Social  Support Transportation  ADL's:  Intact  Cognition:  WNL  Sleep:  Number of Hours: 8.15     Treatment Plan Summary: Daily contact with patient to assess and evaluate symptoms and progress in treatment   Mr. Lindie SpruceWyatt is a 31 year old male with history of depression, anxiety, mood instability and alcoholism admitted for suicidal ideation with a plan to cut himself in the context of treatment noncompliance and drinking.  1. Suicidal ideation. Resolved. The patient is able to contract for safety. He is forward thinking and optimistic about the future. He is a loving father.   2. Mood. He requested Zyprexa in the morning and Seroquel at night for depression and mood stabilization. We added Lithium for further stabilization. Li level in am.  3. Alcohol detox. We started librium taper. Vital signs are stable.  4. Insomnia. This was addressed with Seroquel.   5. Substance abuse treatment. The patient is not interested in pharmacological treatment of alcoholism or residential treatment.  6. Smoking. Nicotine patch is available.  7. Metabolic syndrome monitoring. Lipid panel, TSH and Hemoglobin A1c are normal.   8. EKG. Sinus tachycardia, QTc 428.  9. Disposition. He will be discharged to home with family. He will follow up with [facility].  Kristine LineaJolanta Abrar Bilton, MD 08/04/2016, 11:05 AM

## 2016-08-04 NOTE — [facility] Group Notes (Signed)
[facility] Group Notes:  (Nursing/MHT/Case Management/Adjunct)  Date:  08/04/2016  Time:  4:08 PM  Type of Therapy:  Psychoeducational Skills  Participation Level:  Did Not Attend  [name] W [name] 08/04/2016, 4:08 PM 

## 2016-08-04 NOTE — Progress Notes (Signed)
Recreation Therapy Notes  Date: 05.17.18 Time: 9:30 am Location: Craft Room  Group Topic: Leisure Education  Goal Area(s) Addresses:  Patient will identify things they are grateful for. Patient will identify how being grateful can influence decision making.  Behavioral Response: Did not attend  Intervention: Grateful Wheel  Activity: Patients were given an I Am Grateful For worksheet and were instructed to write things they were grateful for under each category.   Education: LRT educated patient on leisure.  Education Outcome: Patient did not attend group.  Clinical Observations/Feedback: Patient did not attend group.  Jacquelynn CreeGreene,Lamontae Ricardo M, LRT/CTRS 08/04/2016 10:02 AM

## 2016-08-04 NOTE — Plan of Care (Signed)
Problem: Activity: Goal: Interest or engagement in activities will improve Outcome: Not Progressing Pt stays in his room all day, does not attend group. Minimal interaction with staff/peers.

## 2016-08-05 LAB — LITHIUM LEVEL

## 2016-08-05 NOTE — Progress Notes (Signed)
Recreation Therapy Notes  Date: 05.18.18 Time: 9:30 am Location: Craft Room  Group Topic: Coping Skills  Goal Area(s) Addresses:  Patient will identify healthy coping skills. Patient will verbalize benefit of using healthy coping skills.  Behavioral Response: Attentive, Interactive  Intervention: Coping Skills Alphabet  Activity: Patients were given a Naval architectCoping Skills Alphabet worksheet and were instructed to write healthy coping skills for each letter of the alphabet.  Education: LRT educated patients on healthy coping skills.  Education Outcome: Acknowledges education/In group clarification offered  Clinical Observations/Feedback: Patient wrote healthy coping skills. Patient also wrote things like "Gambling on horses" and "cyphoring gas from someone". LRT helped patient think of other coping skills. Patient contributed to group discussion by stating what his mind is focused on while he is using his healthy coping skills, and what emotions he feel when he does unhealthy coping skills.  Jacquelynn CreeGreene,Kamarrion Stfort M, LRT/CTRS 08/05/2016 10:12 AM

## 2016-08-05 NOTE — Progress Notes (Signed)
  [facility] Dis[facility] :  Will you be returning to the same living situation after discharge:  Yes,  home At discharge, do you have transportation home?: Yes,  [employer] Do you have the ability to pay for your medications: No, patient refused referral for additional services.   Release of information consent forms completed and in the chart;  Patient's signature needed at discharge.  Patient to Follow up at: Follow-up Information    [facility] Follow up on 08/09/2016.   Why:  Hospital follow up appointment is on Tuesday, May 22nd at 2:30pm.  Contact information: [address] [place] [id number] [phone]           Next level of care provider [facility] Link:no  Safety Planning and Suicide Prevention discussed: Yes,  with girlfriend and patient  Have you used any form of tobacco in the last 30 days? (Cigarettes, Smokeless Tobacco, Cigars, and/or Pipes): Yes  Has patient been referred to the Quitline?: Patient refused referral  Patient has been referred for addiction treatment: Yes  Libby Goehring G. Garnette CzechSampson MSW, LCSWA 08/05/2016, 2:08 PM

## 2016-08-05 NOTE — Progress Notes (Signed)
Patient discharged home. DC instructions provided and explained. Medications reviewed. Rx given. All questions answered. Belongings returned. Denies SI, HI, AVH. AVS, transition and discharge risk assessment given to patient. Pt stable at discharge.

## 2016-08-05 NOTE — Progress Notes (Signed)
Recreation Therapy Notes  INPATIENT RECREATION TR PLAN  Patient Details Name: Patrick [name] MRN: [id number] DOB: 31-Aug-1985 Today's Date: 08/05/2016  Rec Therapy Plan Is patient appropriate for Therapeutic Recreation?: Yes Treatment times per week: At least once a week TR Treatment/Interventions: 1:1 session, Group participation (Comment) (Appropriate participation in daily recreational therapy tx)  Discharge Criteria Pt will be discharged from therapy if:: Treatment goals are met, Discharged Treatment plan/goals/alternatives discussed and agreed upon by:: Patient/family  Discharge Summary Short term goals set: See Care Plan Short term goals met: Adequate for discharge Progress toward goals comments: One-to-one attended Which groups?: Coping skills, Self-esteem One-to-one attended: Self-esteem, coping skills Reason goals not met: Patient sleeping and did not wake up for one treatment session attempt Therapeutic equipment acquired: None Reason patient discharged from therapy: Discharge from hospital Pt/family agrees with progress & goals achieved: Yes Date patient discharged from therapy: 08/05/16   Leonette Monarch, LRT/CTRS 08/05/2016, 4:19 PM

## 2016-08-05 NOTE — Tx Team (Signed)
Interdisciplinary Treatment and Diagnostic Plan Update  08/05/2016 Time of Session: 10:30am Patrick LarsenWyatt [name] MRN: [id number]  Principal Diagnosis: Schizoaffective disorder, depressive type (HCC)  Secondary Diagnoses: Principal Problem:   Schizoaffective disorder, depressive type (HCC) Active Problems:   Cannabis use disorder, moderate, dependence (HCC)   Alcohol use disorder, moderate, dependence (HCC)   Noncompliance   Suicidal ideation   Tobacco use disorder   Alcohol withdrawal (HCC)   PTSD (post-traumatic stress disorder)   Current Medications:  Current Facility-Administered Medications  Medication Dose Route Frequency Provider Last Rate Last Dose  . acetaminophen (TYLENOL) tablet 650 mg  650 mg Oral Q6H PRN Clapacs, Jackquline DenmarkJohn T, MD   650 mg at 08/05/16 0717  . alum & mag hydroxide-simeth (MAALOX/MYLANTA) 200-200-20 MG/5ML suspension 30 mL  30 mL Oral Q4H PRN Clapacs, Jackquline DenmarkJohn T, MD   30 mL at 08/04/16 2106  . benzocaine (ORAJEL) 10 % mucosal gel   Mouth/Throat TID PRN Daleen Boavi, Himabindu, MD      . hydrOXYzine (ATARAX/VISTARIL) tablet 25 mg  25 mg Oral Once Ravi, Himabindu, MD      . magnesium hydroxide (MILK OF MAGNESIA) suspension 30 mL  30 mL Oral Daily PRN Clapacs, John T, MD      . nicotine (NICODERM CQ - dosed in mg/24 hours) patch 21 mg  21 mg Transdermal Daily Pucilowska, Jolanta B, MD   21 mg at 08/05/16 0717  . OLANZapine (ZYPREXA) tablet 10 mg  10 mg Oral Daily Pucilowska, Jolanta B, MD   10 mg at 08/05/16 0717  . QUEtiapine (SEROQUEL) tablet 300 mg  300 mg Oral QHS Clapacs, Jackquline DenmarkJohn T, MD   300 mg at 08/04/16 2106   Current Outpatient Prescriptions  Medication Sig Dispense Refill  . OLANZapine (ZYPREXA) 10 MG tablet Take 1 tablet (10 mg total) by mouth at bedtime. 30 tablet 11  . QUEtiapine (SEROQUEL) 300 MG tablet Take 1 tablet (300 mg total) by mouth at bedtime. 30 tablet 1   PTA Medications: No prescriptions prior to admission.    Patient Stressors: Financial  difficulties Marital or family conflict Medication change or noncompliance Substance abuse  Patient Strengths: Capable of independent living Manufacturing systems engineerCommunication skills Physical Health Supportive family/friends  Treatment Modalities: Medication Management, Group therapy, Case management,  1 to 1 session with clinician, Psychoeducation, Recreational therapy.   Physician Treatment Plan for Primary Diagnosis: Schizoaffective disorder, depressive type (HCC) Long Term Goal(s): Improvement in symptoms so as ready for discharge Improvement in symptoms so as ready for discharge   Short Term Goals: Ability to identify changes in lifestyle to reduce recurrence of condition will improve Ability to verbalize feelings will improve Ability to disclose and discuss suicidal ideas Ability to demonstrate self-control will improve Ability to identify and develop effective coping behaviors will improve Ability to maintain clinical measurements within normal limits will improve Ability to identify triggers associated with substance abuse/mental health issues will improve Ability to identify changes in lifestyle to reduce recurrence of condition will improve Ability to demonstrate self-control will improve Ability to identify triggers associated with substance abuse/mental health issues will improve  Medication Management: Evaluate patient's response, side effects, and tolerance of medication regimen.  Therapeutic Interventions: 1 to 1 sessions, Unit Group sessions and Medication administration.  Evaluation of Outcomes: Adequate for Discharge  Physician Treatment Plan for Secondary Diagnosis: Principal Problem:   Schizoaffective disorder, depressive type (HCC) Active Problems:   Cannabis use disorder, moderate, dependence (HCC)   Alcohol use disorder, moderate, dependence (HCC)   Noncompliance   Suicidal  ideation   Tobacco use disorder   Alcohol withdrawal (HCC)   PTSD (post-traumatic stress  disorder)  Long Term Goal(s): Improvement in symptoms so as ready for discharge Improvement in symptoms so as ready for discharge   Short Term Goals: Ability to identify changes in lifestyle to reduce recurrence of condition will improve Ability to verbalize feelings will improve Ability to disclose and discuss suicidal ideas Ability to demonstrate self-control will improve Ability to identify and develop effective coping behaviors will improve Ability to maintain clinical measurements within normal limits will improve Ability to identify triggers associated with substance abuse/mental health issues will improve Ability to identify changes in lifestyle to reduce recurrence of condition will improve Ability to demonstrate self-control will improve Ability to identify triggers associated with substance abuse/mental health issues will improve     Medication Management: Evaluate patient's response, side effects, and tolerance of medication regimen.  Therapeutic Interventions: 1 to 1 sessions, Unit Group sessions and Medication administration.  Evaluation of Outcomes: Adequate for Discharge   RN Treatment Plan for Primary Diagnosis: Schizoaffective disorder, depressive type (HCC) Long Term Goal(s): Knowledge of disease and therapeutic regimen to maintain health will improve  Short Term Goals: Ability to participate in decision making will improve, Ability to identify and develop effective coping behaviors will improve and Compliance with prescribed medications will improve  Medication Management: RN will administer medications as ordered by provider, will assess and evaluate patient's response and provide education to patient for prescribed medication. RN will report any adverse and/or side effects to prescribing provider.  Therapeutic Interventions: 1 on 1 counseling sessions, Psychoeducation, Medication administration, Evaluate responses to treatment, Monitor vital signs and CBGs as  ordered, Perform/monitor CIWA, COWS, AIMS and Fall Risk screenings as ordered, Perform wound care treatments as ordered.  Evaluation of Outcomes: Adequate for Discharge   LCSW Treatment Plan for Primary Diagnosis: Schizoaffective disorder, depressive type (HCC) Long Term Goal(s): Safe transition to appropriate next level of care at discharge, Engage patient in therapeutic group addressing interpersonal concerns.  Short Term Goals: Engage patient in aftercare planning with referrals and resources, Increase social support, Increase ability to appropriately verbalize feelings, Increase emotional regulation, Identify triggers associated with mental health/substance abuse issues and Increase skills for wellness and recovery  Therapeutic Interventions: Assess for all discharge needs, 1 to 1 time with Social worker, Explore available resources and support systems, Assess for adequacy in community support network, Educate family and significant other(s) on suicide prevention, Complete Psychosocial Assessment, Interpersonal group therapy.  Evaluation of Outcomes: Adequate for Discharge   Progress in Treatment: Attending groups: No. Participating in groups: No. Taking medication as prescribed: Yes. Toleration medication: Yes. Family/Significant other contact made: Yes, individual(s) contacted:  girlfriend Patient understands diagnosis: Yes. Discussing patient identified problems/goals with staff: Yes. Medical problems stabilized or resolved: Yes. Denies suicidal/homicidal ideation: Yes. Issues/concerns per patient self-inventory: No. Other: n/a  New problem(s) identified: None identified at this time.   New Short Term/Long Term Goal(s): None identified at this time.   Discharge Plan or Barriers: Patient will return home and follow-up with outpatient services.   Reason for Continuation of Hospitalization: Discharge 08/05/2016  Estimated Length of Stay: Discharge  08/05/2016  Attendees: Patient: 08/05/2016 2:10 PM  Physician: Dr. Kristine Linea, MD 08/05/2016 2:10 PM  Nursing: Hulan Amato, RN 08/05/2016 2:10 PM  RN Care Manager: 08/05/2016 2:10 PM  Social Worker: Fredrich Birks. Garnette Czech MSW, LCSWA 08/05/2016 2:10 PM  Recreational Therapist: Jacquelynn Cree, LRT/CTRS 08/05/2016 2:10 PM  Other:  08/05/2016  2:10 PM  Other:  08/05/2016 2:10 PM  Other: 08/05/2016 2:10 PM    Scribe for Treatment Team: Arelia Longest, LCSWA 08/05/2016 2:14 PM

## 2016-08-05 NOTE — Progress Notes (Signed)
Pt alert and oriented in NAD, respirations even and unlabored, Denies HI/SI, Denies AH/VH. Noted to be pacing hallway most of the beginning of shift. Requested oragel for tooth pain and given. Pt took PM medications as scheduled and noted to be resting in bed with eyes closed will continue to monitor.

## 2016-08-05 NOTE — Discharge Summary (Signed)
Physician Discharge Summary Note  Patient:  Patrick [name] is an 31 y.o., male MRN:  [id number] DOB:  11-Dec-1985 Patient phone:  [phone] (home)  Patient address:   [address] [place] [address],  Total Time spent with patient: 30 minutes  Date of Admission:  08/02/2016 Date of Discharge: 08/05/2016  Reason for Admission:  Suicidal ideation.  Identifying data. Patrick [name] is a 31 year old male with history of schizoaffective disorder and alcoholism.  Chief complaint. "I'm shaky."  History of present illness. Information was obtained from the patient and the chart. The patient has a long history of depression, anxiety, mood instability and substance use. He was recently hospitalized at [facility] here in the hospital in [place] for suicidal ideation in the context of drinking. He was prescribed Zyprexa at one facility and Seroquel and other. Apparently he has not been taking medications for the past week. He became increasingly depressed with poor sleep, decreased appetite, anhedonia, being of guilt and hopelessness worthlessness, poor energy and concentration, social isolation, crying spells, heightened anxiety and suicidal thinking with a plan to cut himself. This all happened in the context of heavy drinking a case of beer a day. The patient denies psychotic symptoms but reports severe anxiety. He has panic attacks several times a week, nightmares and flashbacks from his so that he suffered 2 years ago, and OCD type of symptoms with excessive worries and organizing. The patient also reports profound mood swings with periods of hyperactivity, insomnia, talkativeness, poor impulse control. In addition to alcohol his been using marijuana. He denies prescription pill use or other substance use at present.  Past psychiatric history. He suffered severe anxiety since childhood and was in therapy when young. He was diagnosed with schizoaffective disorder and has been treated with multiple  medications but never any mood stabilizer like Depakote and lithium or Tegretol. He responded well to Zyprexa and Seroquel in the past. There were at least one suicide attempt by hanging. The patient used to cut, last time 2 years ago. 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 He was treated for substance abuse twice in residential setting but did not maintain sobriety for longer than a month. There is a history of alcohol withdrawal seizures. 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 He is no longer going there.  Family psychiatric history. Substance abuse on paternal side. He believes that there were family members with undiagnosed mental illness.  Social history. He graduated from high school and went to [employer] on for a year. He supports himself from a trust fund and does not work at present. He lives with his fianc and a 5626-month-old baby. His mother lives nearby.  Principal Problem: Schizoaffective disorder, depressive type [facility](HCC) Discharge Diagnoses: Patient Active Problem List   Diagnosis Date Noted  . Noncompliance [Z91.19] 08/02/2016  . Suicidal ideation [R45.851] 08/02/2016  . Tobacco use disorder [F17.200] 08/02/2016  . Alcohol withdrawal (HCC) [F10.239] 08/02/2016  . PTSD (post-traumatic stress disorder) [F43.10] 08/02/2016  . Schizoaffective disorder, depressive type (HCC) [F25.1] 08/01/2016  . Cannabis use disorder, moderate, dependence (HCC) [F12.20] 08/01/2016  . Alcohol use disorder, moderate, dependence (HCC) [F10.20] 08/01/2016   Past Medical History:  Past Medical History:  Diagnosis Date  . Generalized anxiety disorder   . Major depressive disorder   . Schizoaffective disorder [facility](HCC)     Past Surgical History:  Procedure  Laterality Date  . SPLENECTOMY     Family History: History reviewed. No pertinent family  history.  Social History:  History  Alcohol Use  . Yes    Comment: Occ     History  Drug Use  . Types: Marijuana    Comment: Occ    Social History   Social History  . Marital status: Single    Spouse name: N/A  . Number of children: N/A  . Years of education: N/A   Social History Main Topics  . Smoking status: Current Every Day Smoker    Packs/day: 1.00    Types: Cigarettes  . Smokeless tobacco: Never Used  . Alcohol use Yes     Comment: Occ  . Drug use: Yes    Types: Marijuana     Comment: Occ  . Sexual activity: Not Asked   Other Topics Concern  . None   Social History Narrative  . None    Hospital Course:    Patrick [name] is a 31 year old male with history of depression, anxiety, mood instability and alcoholism admitted for suicidal ideation with a plan to cut himself in the context of treatment noncompliance and drinking.  1. Suicidal ideation. Resolved. The patient is able to contract for safety. He is forward thinking and optimistic about the future. He is a loving father.   2. Mood. He requested Zyprexa in the morning and Seroquel at night for depression and mood stabilization. The patient was offered Lithium in the hospital but he did not take it as evidenced by Lithium level of <0.06 at the time of discharge. We discontinued it.   3. Alcohol detox. We started librium taper. Vital signs are stable.  4. Insomnia. This was addressed with Seroquel.   5. Substance abuse treatment. The patient is not interested in pharmacological treatment of alcoholism or residential treatment.  6. Smoking. Nicotine patch is available.  7. Metabolic syndrome monitoring. Lipid panel, TSH and Hemoglobin A1c are normal.   8. EKG. Sinus tachycardia, QTc 428.  9. Disposition. He will be discharged to home with family. He will follow up with [facility].  Physical Findings: AIMS: Facial and Oral Movements Muscles of Facial Expression: None, normal Lips and Perioral Area:  None, normal Jaw: None, normal Tongue: None, normal,Extremity Movements Upper (arms, wrists, hands, fingers): None, normal Lower (legs, knees, ankles, toes): None, normal, Trunk Movements Neck, shoulders, hips: None, normal, Overall Severity Severity of abnormal movements (highest score from questions above): None, normal Incapacitation due to abnormal movements: None, normal Patient's awareness of abnormal movements (rate only patient's report): No Awareness, Dental Status Current problems with teeth and/or dentures?: No Does patient usually wear dentures?: No  CIWA:    COWS:     Musculoskeletal: Strength & Muscle Tone: within normal limits Gait & Station: normal Patient leans: N/A  Psychiatric Specialty Exam: Physical Exam  Nursing note and vitals reviewed. Psychiatric: He has a normal mood and affect. His speech is normal and behavior is normal. Thought content normal. Cognition and memory are normal. He expresses impulsivity.    Review of Systems  Psychiatric/Behavioral: Positive for substance abuse.  All other systems reviewed and are negative.   Blood pressure 96/69, pulse 94, temperature 98.1 F (36.7 C), temperature source Oral, resp. rate 19, height 5\' 10"  (1.778 m), weight 80.7 kg (178 lb), SpO2 99 %.Body mass index is 25.54 kg/m.  General Appearance: Casual  Eye Contact:  Good  Speech:  Clear and Coherent  Volume:  Normal  Mood:  Euthymic  Affect:  Appropriate  Thought Process:  Goal Directed and Descriptions of Associations: Intact  Orientation:  Full (Time, Place, and Person)  Thought Content:  WDL  Suicidal Thoughts:  No  Homicidal Thoughts:  No  Memory:  Immediate;   Fair Recent;   Fair Remote;   Fair  Judgement:  Poor  Insight:  Lacking  Psychomotor Activity:  Normal  Concentration:  Concentration: Fair and Attention Span: Fair  Recall:  [employer] of Knowledge:  Fair  Language:  Fair  Akathisia:  No  Handed:  Right  AIMS (if indicated):      Assets:  Communication Skills Desire for Improvement Financial Resources/Insurance Housing Intimacy Physical Health Resilience Social Support Transportation  ADL's:  Intact  Cognition:  WNL  Sleep:  Number of Hours: 5.5     Have you used any form of tobacco in the last 30 days? (Cigarettes, Smokeless Tobacco, Cigars, and/or Pipes): Yes  Has this patient used any form of tobacco in the last 30 days? (Cigarettes, Smokeless Tobacco, Cigars, and/or Pipes) Yes, Yes, A prescription for an FDA-approved tobacco cessation medication was offered at discharge and the patient refused  Blood Alcohol level:  Lab Results  Component Value Date   ETH 94 (H) 07/30/2016    Metabolic Disorder Labs:  Lab Results  Component Value Date   HGBA1C 5.0 08/02/2016   MPG 97 08/02/2016   Lab Results  Component Value Date   PROLACTIN 21.0 (H) 08/02/2016   Lab Results  Component Value Date   CHOL 208 (H) 08/02/2016   TRIG 153 (H) 08/02/2016   HDL 63 08/02/2016   CHOLHDL 3.3 08/02/2016   VLDL 31 08/02/2016   LDLCALC 114 (H) 08/02/2016    See Psychiatric Specialty Exam and Suicide Risk Assessment completed by Attending Physician prior to discharge.  Discharge destination:  Home  Is patient on multiple antipsychotic therapies at discharge:  Yes,   Do you recommend tapering to monotherapy for antipsychotics?  No   Has Patient had three or more failed trials of antipsychotic monotherapy by history:  No  Recommended Plan for Multiple Antipsychotic Therapies: Additional reason(s) for multiple antispychotic treatment:  ineffective single agent.  Discharge Instructions    Diet - low sodium heart healthy    Complete by:  As directed    Increase activity slowly    Complete by:  As directed      Allergies as of 08/05/2016      Reactions   Sulfa Antibiotics Anaphylaxis   Antihistamines, Chlorpheniramine-type Hives   Diphenhydramine Hcl Palpitations      Medication List    STOP taking these  medications   buprenorphine-naloxone 8-2 mg Subl SL tablet Commonly known as:  SUBOXONE   sertraline 100 MG tablet Commonly known as:  ZOLOFT     TAKE these medications     Indication  OLANZapine 10 MG tablet Commonly known as:  ZYPREXA Take 1 tablet (10 mg total) by mouth at bedtime. What changed:  Another medication with the same name was removed. Continue taking this medication, and follow the directions you see here.  Indication:  Manic-Depression   QUEtiapine 300 MG tablet Commonly known as:  SEROQUEL Take 1 tablet (300 mg total) by mouth at bedtime.  Indication:  Depressive Phase of Manic-Depression      Follow-up Information    [facility] Follow up on 08/09/2016.   Why:  Hospital follow up appointment is on Tuesday, May 22nd at 2:30pm.  Contact information:  [address] [place] [address] [phone]           Follow-up recommendations:  Activity:  As tolerated. Diet:  Regular. Other:  Keep follow-up appointments.  Comments:    Signed: Kristine Linea, MD 08/05/2016, 9:40 AM

## 2016-08-05 NOTE — Plan of Care (Signed)
Problem: [facility] Participation in Recreation Therapeutic Interventions Goal: STG-Patient will demonstrate improved self esteem by identif STG: Self-Esteem - Within 4 treatment sessions, patient will verbalize at least 5 positive affirmation statements in each of 2 treatment sessions to increase self-esteem.  Outcome: Adequate for Discharge Treatment Session 1; Completed 1 out of 2: At approximately 8:45 am, LRT met wit patient in consultation room. Patient verbalized 5 positive affirmation statements. Patient reported it felt "pretty good". LRT encouraged patient to continue saying positive affirmation statements.  Leonette Monarch, LRT/CTRS 05.18.18 4:16 pm Goal: STG-Patient will identify at least five coping skills for ** STG: Coping Skills - Within 4 treatment sessions, patient will verbalize at least 5 coping skills for substance abuse in each of 2 treatment sessions to decrease substance abuse.  Outcome: Adequate for Discharge Treatment Session 1; Completed 1 out of 2: At approximately 8:45 am, LRT met with patient in consultation room. Patient verbalized 5 coping skills for substance abuse. LRT educated patient on leisure and why it is important to implement it into his schedule. LRT educated and provided patient with blank schedules to help him plan his day and try to avoid using substances. LRT educated patient on healthy support systems.  Leonette Monarch, LRT/CTRS 05.18.18 4:18 pm

## 2016-08-05 NOTE — Plan of Care (Signed)
Problem: Safety: Goal: Periods of time without injury will increase Outcome: Progressing Pt safe on unit at this time. Pt has no visible signs of injury.

## 2016-08-05 NOTE — [facility] Suicide Risk Assessment (Signed)
[facility]   Principal Problem: Schizoaffective disorder, depressive type [facility](HCC) Discharge Diagnoses:  Patient Active Problem List   Diagnosis Date Noted  . Noncompliance [Z91.19] 08/02/2016  . Suicidal ideation [R45.851] 08/02/2016  . Tobacco use disorder [F17.200] 08/02/2016  . Alcohol withdrawal (HCC) [F10.239] 08/02/2016  . PTSD (post-traumatic stress disorder) [F43.10] 08/02/2016  . Schizoaffective disorder, depressive type (HCC) [F25.1] 08/01/2016  . Cannabis use disorder, moderate, dependence (HCC) [F12.20] 08/01/2016  . Alcohol use disorder, moderate, dependence (HCC) [F10.20] 08/01/2016    Total Time spent with patient: 30 minutes  Musculoskeletal: Strength & Muscle Tone: within normal limits Gait & Station: normal Patient leans: N/A  Psychiatric Specialty Exam: Review of Systems  Psychiatric/Behavioral: Positive for substance abuse.  All other systems reviewed and are negative.   Blood pressure 96/69, pulse 94, temperature 98.1 F (36.7 C), temperature source Oral, resp. rate 19, height 5\' 10"  (1.778 m), weight 80.7 kg (178 lb), SpO2 99 %.Body mass index is 25.54 kg/m.  General Appearance: Casual  Eye Contact::  Good  Speech:  Clear and Coherent409  Volume:  Normal  Mood:  Euthymic  Affect:  Appropriate  Thought Process:  Goal Directed and Descriptions of Associations: Intact  Orientation:  Full (Time, Place, and Person)  Thought Content:  WDL  Suicidal Thoughts:  No  Homicidal Thoughts:  No  Memory:  Immediate;   Fair Recent;   Fair Remote;   Fair  Judgement:  Poor  Insight:  Lacking  Psychomotor Activity:  Normal  Concentration:  Fair  Recall:  [employer]  Fund of Knowledge:Fair  Language: Fair  Akathisia:  No  Handed:  Right  AIMS (if indicated):     Assets:  Communication Skills Desire for Improvement Financial Resources/Insurance Housing Intimacy Physical Health Resilience Social Support Transportation  Sleep:  Number  of Hours: 5.5  Cognition: WNL  ADL's:  Intact   Mental Status Per Nursing Assessment::   On Admission:  Suicidal ideation indicated by others  Demographic Factors:  Male, Caucasian and Unemployed  Loss Factors: NA  Historical Factors: Prior suicide attempts, Family history of mental illness or substance abuse and Impulsivity  Risk Reduction Factors:   Responsible for children under 31 years of age, Sense of responsibility to family, Living with another person, especially a relative and Positive social support  Continued Clinical Symptoms:  Bipolar Disorder:   Depressive phase Depression:   Comorbid alcohol abuse/dependence Impulsivity Alcohol/Substance Abuse/Dependencies  Cognitive Features That Contribute To Risk:  None    Suicide Risk:  Minimal: No identifiable suicidal ideation.  Patients presenting with no risk factors but with morbid ruminations; may be classified as minimal risk based on the severity of the depressive symptoms  Follow-up Information    [facility] Follow up on 08/09/2016.   Why:  Hospital follow up appointment is on Tuesday, May 22nd at 2:30pm.  Contact information: [address] [place] [address] [phone]           Plan Of Care/Follow-up recommendations:  Activity:  as tolerated Diet:  regular. Other:  keep follow up appointments.  Kristine LineaJolanta Jackelyne Sayer, MD 08/05/2016, 8:10 AM

## 2016-09-10 ENCOUNTER — Encounter: Payer: Self-pay | Admitting: *Deleted

## 2016-09-10 ENCOUNTER — Emergency Department
Admission: EM | Admit: 2016-09-10 | Discharge: 2016-09-12 | Disposition: A | Discharge status: Other Acute Inpatient Hospital | Payer: Self-pay | Attending: Emergency Medicine | Admitting: Emergency Medicine

## 2016-09-10 DIAGNOSIS — F411 Generalized anxiety disorder: Secondary | ICD-10-CM | POA: Diagnosis present

## 2016-09-10 DIAGNOSIS — F1010 Alcohol abuse, uncomplicated: Secondary | ICD-10-CM | POA: Insufficient documentation

## 2016-09-10 DIAGNOSIS — F17200 Nicotine dependence, unspecified, uncomplicated: Secondary | ICD-10-CM | POA: Diagnosis present

## 2016-09-10 DIAGNOSIS — R45851 Suicidal ideations: Secondary | ICD-10-CM

## 2016-09-10 DIAGNOSIS — F10939 Alcohol use, unspecified with withdrawal, unspecified: Secondary | ICD-10-CM | POA: Diagnosis present

## 2016-09-10 DIAGNOSIS — F322 Major depressive disorder, single episode, severe without psychotic features: Secondary | ICD-10-CM | POA: Insufficient documentation

## 2016-09-10 DIAGNOSIS — Z91199 Patient's noncompliance with other medical treatment and regimen due to unspecified reason: Secondary | ICD-10-CM

## 2016-09-10 DIAGNOSIS — Z79899 Other long term (current) drug therapy: Secondary | ICD-10-CM | POA: Insufficient documentation

## 2016-09-10 DIAGNOSIS — Z9119 Patient's noncompliance with other medical treatment and regimen: Secondary | ICD-10-CM

## 2016-09-10 DIAGNOSIS — F10239 Alcohol dependence with withdrawal, unspecified: Secondary | ICD-10-CM | POA: Diagnosis present

## 2016-09-10 DIAGNOSIS — F17210 Nicotine dependence, cigarettes, uncomplicated: Secondary | ICD-10-CM | POA: Insufficient documentation

## 2016-09-10 DIAGNOSIS — F1220 Cannabis dependence, uncomplicated: Secondary | ICD-10-CM | POA: Diagnosis present

## 2016-09-10 DIAGNOSIS — F1020 Alcohol dependence, uncomplicated: Secondary | ICD-10-CM | POA: Diagnosis present

## 2016-09-10 LAB — CBC
HEMATOCRIT: 42.8 % (ref 40.0–52.0)
HEMOGLOBIN: 14.6 g/dL (ref 13.0–18.0)
MCH: 29.5 pg (ref 26.0–34.0)
MCHC: 34.2 g/dL (ref 32.0–36.0)
MCV: 86.4 fL (ref 80.0–100.0)
Platelets: 253 10*3/uL (ref 150–440)
RBC: 4.95 MIL/uL (ref 4.40–5.90)
RDW: 13.8 % (ref 11.5–14.5)
WBC: 7 10*3/uL (ref 3.8–10.6)

## 2016-09-10 LAB — COMPREHENSIVE METABOLIC PANEL
ALK PHOS: 77 U/L (ref 38–126)
ALT: 28 U/L (ref 17–63)
AST: 47 U/L — AB (ref 15–41)
Albumin: 4.7 g/dL (ref 3.5–5.0)
Anion gap: 11 (ref 5–15)
BUN: 7 mg/dL (ref 6–20)
CALCIUM: 9 mg/dL (ref 8.9–10.3)
CHLORIDE: 100 mmol/L — AB (ref 101–111)
CO2: 23 mmol/L (ref 22–32)
CREATININE: 0.67 mg/dL (ref 0.61–1.24)
GFR calc non Af Amer: >60 mL/min (ref >60)
Glucose, Bld: 112 mg/dL — ABNORMAL HIGH (ref 65–99)
Potassium: 3.7 mmol/L (ref 3.5–5.1)
SODIUM: 134 mmol/L — AB (ref 135–145)
Total Bilirubin: 0.6 mg/dL (ref 0.3–1.2)
Total Protein: 8 g/dL (ref 6.5–8.1)

## 2016-09-10 LAB — URINE DRUG SCREEN, QUALITATIVE (ARMC ONLY)
Amphetamines, Ur Screen: NOT DETECTED
BARBITURATES, UR SCREEN: NOT DETECTED
Benzodiazepine, Ur Scrn: NOT DETECTED
CANNABINOID 50 NG, UR ~~LOC~~: POSITIVE — AB
COCAINE METABOLITE, UR ~~LOC~~: NOT DETECTED
MDMA (ECSTASY) UR SCREEN: NOT DETECTED
Methadone Scn, Ur: NOT DETECTED
Opiate, Ur Screen: NOT DETECTED
PHENCYCLIDINE (PCP) UR S: NOT DETECTED
Tricyclic, Ur Screen: NOT DETECTED

## 2016-09-10 LAB — ETHANOL: Alcohol, Ethyl (B): 200 mg/dL — ABNORMAL HIGH (ref <5)

## 2016-09-10 LAB — SALICYLATE LEVEL: Salicylate Lvl: <7 mg/dL (ref 2.8–30.0)

## 2016-09-10 LAB — ACETAMINOPHEN LEVEL: Acetaminophen (Tylenol), Serum: <10 ug/mL — ABNORMAL LOW (ref 10–30)

## 2016-09-10 MED ORDER — LORAZEPAM 2 MG/ML IJ SOLN
0.0000 mg | Freq: Four times a day (QID) | INTRAMUSCULAR | Status: AC
Start: 2016-09-10 — End: 2016-09-12

## 2016-09-10 MED ORDER — FOLIC ACID 1 MG PO TABS
1.0000 mg | ORAL_TABLET | Freq: Every day | ORAL | Status: DC
  Administered 2016-09-10 – 2016-09-12 (×3): 1 mg via ORAL
  Filled 2016-09-10 (×3): qty 1

## 2016-09-10 MED ORDER — CHLORDIAZEPOXIDE HCL 25 MG PO CAPS
25.0000 mg | ORAL_CAPSULE | Freq: Three times a day (TID) | ORAL | Status: AC
  Administered 2016-09-10 – 2016-09-12 (×6): 25 mg via ORAL
  Filled 2016-09-10 (×6): qty 1

## 2016-09-10 MED ORDER — LORAZEPAM 2 MG PO TABS
0.0000 mg | ORAL_TABLET | Freq: Two times a day (BID) | ORAL | Status: DC
Start: 2016-09-13 — End: 2016-09-12

## 2016-09-10 MED ORDER — LORAZEPAM 2 MG/ML IJ SOLN: 0.0000 mg | Freq: Two times a day (BID) | INTRAMUSCULAR | Status: DC

## 2016-09-10 MED ORDER — VITAMIN B-1 100 MG PO TABS
100.0000 mg | ORAL_TABLET | Freq: Every day | ORAL | Status: DC
  Administered 2016-09-10 – 2016-09-12 (×3): 100 mg via ORAL
  Filled 2016-09-10 (×3): qty 1

## 2016-09-10 MED ORDER — ADULT MULTIVITAMIN W/MINERALS CH
1.0000 | ORAL_TABLET | Freq: Every day | ORAL | Status: DC
  Administered 2016-09-11 – 2016-09-12 (×2): 1 via ORAL
  Filled 2016-09-10 (×2): qty 1

## 2016-09-10 MED ORDER — LORAZEPAM 2 MG PO TABS
0.0000 mg | ORAL_TABLET | Freq: Four times a day (QID) | ORAL | Status: AC
  Administered 2016-09-10: 2 mg via ORAL
  Administered 2016-09-11 – 2016-09-12 (×3): 1 mg via ORAL
  Administered 2016-09-12: 2 mg via ORAL
  Filled 2016-09-10 (×3): qty 1

## 2016-09-10 NOTE — ED Notes (Signed)
BEHAVIORAL HEALTH ROUNDING  Patient sleeping: No.  Patient alert and oriented: yes  Behavior appropriate: Yes. ; If no, describe:  Nutrition and fluids offered: Yes  Toileting and hygiene offered: Yes  Sitter present: not applicable, Q 15 min safety rounds and observation.  Law enforcement present: Yes ODS  

## 2016-09-10 NOTE — ED Triage Notes (Signed)
Pt states he is depressed.  Denies HI.  Pt requesting help with etoh abuse.  Denies drug use.  Pt states SI.  Pt is calm and cooperative.

## 2016-09-10 NOTE — ED Notes (Signed)

## 2016-09-10 NOTE — ED Provider Notes (Signed)
[facility] Emergency Department Provider Note  ____________________________________________  Time seen: Approximately 10:44 PM  I have reviewed the triage vital signs and the nursing notes.   HISTORY  Chief Complaint Behavior Problem    HPI Patrick [name] is a 31 y.o. male who comes to the ED complaining of depression. He states that he is having very poor moods. Not sleeping well, not eating. Drinking a 12 pack of beer a day. He reports that when he doesn't drink he gets withdrawal symptoms including shakes hallucinations and seizures.  He's been having suicidal ideation with a plan to hang himself in the woods where his family won't find him. He is very stressed about the loss of his job. The symptoms of been constant and worsening aggravated by financial constraints and the fact that his fianc does not trust him to be a caretaker further baby while she is at work. No alleviating factors    Past Medical History:  Diagnosis Date  . Generalized anxiety disorder   . Major depressive disorder   . Schizoaffective disorder [facility](HCC)      Patient Active Problem List   Diagnosis Date Noted  . Noncompliance 08/02/2016  . Suicidal ideation 08/02/2016  . Tobacco use disorder 08/02/2016  . Alcohol withdrawal (HCC) 08/02/2016  . PTSD (post-traumatic stress disorder) 08/02/2016  . Schizoaffective disorder, depressive type (HCC) 08/01/2016  . Cannabis use disorder, moderate, dependence (HCC) 08/01/2016  . Alcohol use disorder, moderate, dependence (HCC) 08/01/2016     Past Surgical History:  Procedure Laterality Date  . SPLENECTOMY       Prior to Admission medications   Medication Sig Start Date End Date Taking? Authorizing Provider  buprenorphine-naloxone (SUBOXONE) 8-2 mg SUBL SL tablet Place 2 tablets under the tongue daily. 09/01/16  Yes [provider]     Allergies Sulfa antibiotics; Antihistamines, chlorpheniramine-type; and Diphenhydramine  hcl   No family history on file.  Social History Social History  Substance Use Topics  . Smoking status: Current Every Day Smoker    Packs/day: 1.00    Types: Cigarettes  . Smokeless tobacco: Never Used  . Alcohol use Yes     Comment: Occ    Review of Systems  Constitutional:   No fever or chills.  ENT:   No sore throat. No rhinorrhea. Cardiovascular:   No chest pain or syncope. Respiratory:   No dyspnea or cough. Gastrointestinal:   Negative for abdominal pain, vomiting and diarrhea.  Musculoskeletal:   Negative for focal pain or swelling All other systems reviewed and are negative except as documented above in ROS and HPI.  ____________________________________________   PHYSICAL EXAM:  VITAL SIGNS: ED Triage Vitals  Enc Vitals Group     BP 09/10/16 1847 126/78     Pulse Rate 09/10/16 1847 94     Resp 09/10/16 1847 20     Temp 09/10/16 1847 98.1 F (36.7 C)     Temp Source 09/10/16 1847 Oral     SpO2 09/10/16 1847 99 %     Weight 09/10/16 1848 180 lb (81.6 kg)     Height 09/10/16 1848 5\' 10"  (1.778 m)     Head Circumference --      Peak Flow --      Pain Score --      Pain Loc --      Pain Edu? --      Excl. in GC? --     Vital signs reviewed, nursing assessments reviewed.   Constitutional:  Alert and oriented. Well appearing and in no distress. Eyes:   No scleral icterus.  EOMI. No nystagmus. No conjunctival pallor. PERRL. ENT   Head:   Normocephalic and atraumatic.   Nose:   No congestion/rhinnorhea.    Mouth/Throat:   MMM, no pharyngeal erythema. No peritonsillar mass.    Neck:   No meningismus. Full ROM Hematological/Lymphatic/Immunilogical:   No cervical lymphadenopathy. Cardiovascular:   RRR. Symmetric bilateral radial and DP pulses.  No murmurs.  Respiratory:   Normal respiratory effort without tachypnea/retractions. Breath sounds are clear and equal bilaterally. No wheezes/rales/rhonchi. Gastrointestinal:   Soft and nontender. Non  distended. There is no CVA tenderness.  No rebound, rigidity, or guarding. Genitourinary:   deferred Musculoskeletal:   Normal range of motion in all extremities. No joint effusions.  No lower extremity tenderness.  No edema. Neurologic:   Normal speech and language.  Motor grossly intact. No gross focal neurologic deficits are appreciated.  Skin:    Skin is warm, dry and intact. No rash noted.  No petechiae, purpura, or bullae.  ____________________________________________    LABS (pertinent positives/negatives) (all labs ordered are listed, but only abnormal results are displayed) Labs Reviewed  COMPREHENSIVE METABOLIC PANEL - Abnormal; Notable for the following:       Result Value   Sodium 134 (*)    Chloride 100 (*)    Glucose, Bld 112 (*)    AST 47 (*)    All other components within normal limits  ETHANOL - Abnormal; Notable for the following:    Alcohol, Ethyl (B) 200 (*)    All other components within normal limits  ACETAMINOPHEN LEVEL - Abnormal; Notable for the following:    Acetaminophen (Tylenol), Serum <10 (*)    All other components within normal limits  URINE DRUG SCREEN, QUALITATIVE ([facility] ONLY) - Abnormal; Notable for the following:    Cannabinoid 50 Ng, Ur [place] POSITIVE (*)    All other components within normal limits  SALICYLATE LEVEL  CBC   ____________________________________________   EKG    ____________________________________________    RADIOLOGY  No results found.  ____________________________________________   PROCEDURES Procedures  ____________________________________________   INITIAL IMPRESSION / ASSESSMENT AND PLAN / ED COURSE  Pertinent labs & imaging results that were available during my care of the patient were reviewed by me and considered in my medical decision making (see chart for details).  Patient well appearing no acute distress normal vital signs medically clear. I risk for alcohol withdrawal, we'll start CIWA  protocol. 3 times a day Librium as well. Folate and thiamine. Discussed with psychiatry who agrees the patient is high risk for self-harm and warrants inpatient treatment for his depression. I have placed an involuntary commitment hold on the patient which I'll continue at this time.no evidence of any current injury or ingestion      ____________________________________________   FINAL CLINICAL IMPRESSION(S) / ED DIAGNOSES  Final diagnoses:  Alcohol abuse  Severe depression (HCC)      New Prescriptions   No medications on file     Portions of this note were generated with dragon dictation software. Dictation errors may occur despite best attempts at proofreading.    Sharman Cheek, MD 09/10/16 [phone]

## 2016-09-10 NOTE — BH Assessment (Signed)
Assessment Note  Patrick LarsenWyatt [name] is an 31 y.o. male. [facility] and to detox from alcohol.  The patient reported he has thoughts to go in the woods and to hang himself.  He denied having a specific plan of when he would hang himself.  He denies having much of an appetite, and is not sleeping much at night.  He has a history of cutting.  He currently drinks a 12 pack of beer and three 24 oz beers a day.  He last had a drink earlier today.  When he arrived in the ED his BAC was 200.  His last inpatient treatment was 07/2016 when he came in for detox and SI.  The patient reported he came in today due to his fiance saying she will leave him if he doesn't stop drinking.  He lives with his fiance and 757 month old child.  At that time he was also having command auditory hallucinations.  He denies having hallucinations now and stated the hallucinations were due to detoxing from alcohol.  He is not currently receiving any treatment and is not taking psychiatric medication.  The patient is also using cannabis.  He denies using any other drugs.  He denies previous abuse and HI.  Diagnosis: Major depressive disorder, Alcohol use Disorder, Severe  Past Medical History:  Past Medical History:  Diagnosis Date  . Generalized anxiety disorder   . Major depressive disorder   . Schizoaffective disorder [facility](HCC)     Past Surgical History:  Procedure Laterality Date  . SPLENECTOMY      Family History: No family history on file.  Social History:  reports that he has been smoking Cigarettes.  He has been smoking about 1.00 pack per day. He has never used smokeless tobacco. He reports that he drinks alcohol. He reports that he uses drugs, including Marijuana.  Additional Social History:  Alcohol / Drug Use Pain Medications: See PTA Prescriptions: See PTA Over the Counter: See PTA History of alcohol / drug use?: Yes Longest period of sobriety (when/how long): unknown Negative Consequences of Use:  Financial Withdrawal Symptoms: Patient aware of relationship between substance abuse and physical/medical complications, Nausea / Vomiting Substance #1 Name of Substance 1: Alcohol 1 - Age of First Use: unknown 1 - Amount (size/oz): 12 pack of beer and three 20 oz beers a day 1 - Frequency: daily 1 - Last Use / Amount: 09/10/2016  CIWA: CIWA-Ar BP: 126/78 Pulse Rate: 94 Nausea and Vomiting: no nausea and no vomiting Tactile Disturbances: mild itching, pins and needles, burning or numbness Tremor: two Auditory Disturbances: not present Paroxysmal Sweats: no sweat visible Visual Disturbances: not present Anxiety: moderately anxious, or guarded, so anxiety is inferred Headache, Fullness in Head: mild Agitation: somewhat more than normal activity Orientation and Clouding of Sensorium: oriented and can do serial additions CIWA-Ar Total: 11 COWS:    Allergies:  Allergies  Allergen Reactions  . Sulfa Antibiotics Anaphylaxis  . Antihistamines, Chlorpheniramine-Type Hives  . Diphenhydramine Hcl Palpitations    Home Medications:  (Not in a hospital admission)  OB/GYN Status:  No LMP for male patient.  General Assessment Data [facility] Assessment: In system Is this a Tele or Face-to-Face Assessment?: Face-to-Face Is this an Initial Assessment or a Re-assessment for this encounter?: Initial Assessment Marital status: Long term relationship (fiance) Maiden name: NA Is patient pregnant?: Other (Comment) (NA) Pregnancy Status: Other (Comment) (NA) Living Arrangements: Spouse/significant other, Children Can pt return to  current living arrangement?: Yes Admission Status: Voluntary Is patient capable of signing voluntary admission?: Yes Referral Source: Self/Family/Friend Insurance type: None  Medical Screening Exam [facility] Walk-in ONLY) Medical Exam completed: Yes  Crisis Care Plan Living Arrangements: Spouse/significant other, Children Legal Guardian:   (NA) Name of Psychiatrist: none Name of Therapist: none  Education Status Is patient currently in school?: No Current Grade: NA Highest grade of school patient has completed: [employer] Name of school: N/A Contact person: N/A  Risk to self with the past 6 months Suicidal Ideation: Yes-Currently Present Has patient been a risk to self within the past 6 months prior to admission? : Yes Suicidal Intent: Yes-Currently Present Has patient had any suicidal intent within the past 6 months prior to admission? : Yes Is patient at risk for suicide?: Yes Suicidal Plan?: Yes-Currently Present Has patient had any suicidal plan within the past 6 months prior to admission? : Yes Specify Current Suicidal Plan: hang self in the woods Access to Means: Yes Specify Access to Suicidal Means: has access to something to hang himself. What has been your use of drugs/alcohol within the last 12 months?: daily alcohol use and occasional cannabis use. Previous Attempts/Gestures: No How many times?: 0 Other Self Harm Risks: cutting Triggers for Past Attempts: Unknown Intentional Self Injurious Behavior: Cutting Comment - Self Injurious Behavior: past cutting behavior Family Suicide History: Unknown Recent stressful life event(s): Financial Problems, Conflict (Comment) (Fiance stated she will leave if he does not become sober) Persecutory voices/beliefs?: No Depression: Yes Depression Symptoms: Insomnia, Loss of interest in usual pleasures Substance abuse history and/or treatment for substance abuse?: Yes Suicide prevention information given to non-admitted patients: Yes  Risk to Others within the past 6 months Homicidal Ideation: No Does patient have any lifetime risk of violence toward others beyond the six months prior to admission? : No Thoughts of Harm to Others: No Current Homicidal Intent: No Current Homicidal Plan: No Access to Homicidal Means: No Identified Victim: NA History of harm to others?:  No Assessment of Violence: None Noted Violent Behavior Description: NA Does patient have access to weapons?: No Criminal Charges Pending?: No Does patient have a court date: No Is patient on probation?: No  Psychosis Hallucinations: None noted Delusions: None noted  Mental Status Report Appearance/Hygiene: In scrubs, Unremarkable Eye Contact: Good Motor Activity: Freedom of movement, Unremarkable Speech: Logical/coherent Level of Consciousness: Alert Mood: Depressed Affect: Appropriate to circumstance Anxiety Level: Minimal Thought Processes: Relevant Judgement: Unimpaired Orientation: Person, Place, Time, Situation Obsessive Compulsive Thoughts/Behaviors: None  Cognitive Functioning Concentration: Normal Memory: Recent Intact, Remote Intact IQ: Average Insight: Good Impulse Control: Fair Appetite: Poor Weight Loss: 0 Weight Gain: 0 Sleep: Decreased Total Hours of Sleep: 3 Vegetative Symptoms: None  ADLScreening [facility] Assessment Services) Patient's cognitive ability adequate to safely complete daily activities?: Yes Patient able to express need for assistance with ADLs?: Yes Independently performs ADLs?: Yes (appropriate for developmental age)  Prior Inpatient Therapy Prior Inpatient Therapy: Yes Prior Therapy Dates: 07/2016 Prior Therapy Facilty/Provider(s): [facility] Reason for Treatment: detox and suicidal thoughts  Prior Outpatient Therapy Prior Outpatient Therapy: No Prior Therapy Dates: NA Prior Therapy Facilty/Provider(s): NA Reason for Treatment: NA Does patient have an ACCT team?: No Does patient have Intensive In-House Services?  : No Does patient have [facility] services? : No Does patient have P4CC services?: No  ADL Screening (condition at time of admission) Patient's cognitive ability adequate to safely complete daily activities?: Yes Is the patient deaf or have difficulty hearing?: No Does  the patient have difficulty seeing, even when wearing  glasses/contacts?: No Does the patient have difficulty concentrating, remembering, or making decisions?: No Patient able to express need for assistance with ADLs?: Yes Does the patient have difficulty dressing or bathing?: No Independently performs ADLs?: Yes (appropriate for developmental age) Does the patient have difficulty walking or climbing stairs?: No Weakness of Legs: None Weakness of Arms/Hands: None  Home Assistive Devices/Equipment Home Assistive Devices/Equipment: None  Therapy Consults (therapy consults require a physician order) PT Evaluation Needed: No OT Evalulation Needed: No SLP Evaluation Needed: No Abuse/Neglect Assessment (Assessment to be complete while patient is alone) Physical Abuse: Denies Verbal Abuse: Denies Sexual Abuse: Denies Exploitation of patient/patient's resources: Denies Self-Neglect: Denies Values / Beliefs Cultural Requests During Hospitalization: None Spiritual Requests During Hospitalization: None Consults Spiritual Care Consult Needed: No Social Work Consult Needed: No Merchant navy officer (For Healthcare) Does Patient Have a Medical Advance Directive?: No    Additional Information 1:1 In Past 12 Months?: No CIRT Risk: No Elopement Risk: No Does patient have medical clearance?: Yes  Child/Adolescent Assessment Running Away Risk: Denies (Patient is an adult)  Disposition:  Disposition Initial Assessment Completed for this Encounter: Yes Disposition of Patient: Referred to [facility])  On Site Evaluation by:   Reviewed with Physician:    Ottis Stain 09/10/2016 9:20 PM

## 2016-09-11 MED ORDER — LORAZEPAM 1 MG PO TABS
ORAL_TABLET | ORAL | Status: AC
  Administered 2016-09-11: 1 mg via ORAL
  Filled 2016-09-11: qty 1

## 2016-09-11 MED ORDER — LORAZEPAM 1 MG PO TABS
ORAL_TABLET | ORAL | Status: AC
  Administered 2016-09-11: 1 mg
  Filled 2016-09-11: qty 1

## 2016-09-11 MED ORDER — TRAZODONE HCL 50 MG PO TABS: 50.0000 mg | ORAL_TABLET | Freq: Every evening | ORAL | Status: DC | PRN

## 2016-09-11 MED ORDER — NICOTINE POLACRILEX 2 MG MT GUM
2.0000 mg | CHEWING_GUM | OROMUCOSAL | Status: DC | PRN
  Administered 2016-09-11 – 2016-09-12 (×9): 2 mg via ORAL
  Filled 2016-09-11 (×9): qty 1

## 2016-09-11 NOTE — ED Notes (Signed)

## 2016-09-11 NOTE — BH Assessment (Signed)
Referral information for patient have been faxed to;     [facility] ([phone])   [facility] ([phone]),   [facility] ([phone]),   [facility] ([phone])  [facility] ([phone])  [facility][phone])  [facility] [phone])  [facility] ([phone])  [facility] ([phone])  [facility] ([phone])  [facility] ([phone])  [facility] ([phone])  [facility] ([phone])  [facility] ([phone])  [facility] ([phone])  [facility] ([phone])  [facility] ([phone])  [facility] ([phone])  [facility] ([phone])  [facility] ([phone])  Called [facility] and there were not any adult beds available per AC (Kim).   

## 2016-09-11 NOTE — ED Notes (Signed)
Pt was pacing in his room. "Is there anything else you can give me? I feel like I'm about to come out of my skin." Pt endorses multiple S&S of alcohol withdrawal. Medication given; see MAR.

## 2016-09-11 NOTE — ED Notes (Signed)
Pt. Alert and oriented, warm and dry, in no distress. Pt. Denies SI, HI, and AVH. Pt CIWA score was an 7.  1mg  ativan given. Patient is currently in shower. Pt. Encouraged to let nursing staff know of any concerns or needs.

## 2016-09-11 NOTE — BH Assessment (Addendum)
[facility], [facility], [facility], [facility], [facility]:  [facility]- to call back with bed availability [facility]- per [employer] at capacity [facility]- per [facility], will consider but did not promising [facility]- per Rosey B[facility], are considering discharges today but probably will not take new patients till tomorrow.

## 2016-09-11 NOTE — ED Notes (Signed)
Pt denies SI, HI, and AVH. He cites tremors, nausea, feeling as if something is crawling on his skin, and general unease. When this writer felt back back of pt's neck, he said that he had just wiped it dry. Pt given 1 mg Ativan for CIWA of 7. Pt has an "S S" tattoo on his left bicep. Will continue to monitor for needs/safety.

## 2016-09-11 NOTE — ED Provider Notes (Signed)
-----------------------------------------   7:13 AM on 09/11/2016 -----------------------------------------   Blood pressure 127/68, pulse 77, temperature 98.4 F (36.9 C), temperature source Oral, resp. rate 20, height 5\' 10"  (1.778 m), weight 81.6 kg (180 lb), SpO2 97 %.  The patient had no acute events since last update.  Calm and cooperative at this time.  Disposition is pending Psychiatry/Behavioral Medicine team recommendations.     Merrily Brittleifenbark, Olen Eaves, MD 09/11/16 [phone]

## 2016-09-11 NOTE — ED Notes (Signed)
Sandwich and soft drink given.  

## 2016-09-12 ENCOUNTER — Encounter: Payer: Self-pay | Admitting: Psychiatry

## 2016-09-12 ENCOUNTER — Inpatient Hospital Stay
Admission: RE | Admit: 2016-09-12 | Discharge: 2016-09-15 | DRG: 885 | Disposition: A | Discharge status: Home / Self Care | Payer: No Typology Code available for payment source | Source: Intra-hospital | Attending: Psychiatry | Admitting: Psychiatry

## 2016-09-12 DIAGNOSIS — F17210 Nicotine dependence, cigarettes, uncomplicated: Secondary | ICD-10-CM | POA: Diagnosis present

## 2016-09-12 DIAGNOSIS — Z915 Personal history of self-harm: Secondary | ICD-10-CM | POA: Diagnosis not present

## 2016-09-12 DIAGNOSIS — F251 Schizoaffective disorder, depressive type: Secondary | ICD-10-CM | POA: Diagnosis present

## 2016-09-12 DIAGNOSIS — F1020 Alcohol dependence, uncomplicated: Secondary | ICD-10-CM | POA: Diagnosis present

## 2016-09-12 DIAGNOSIS — Z9119 Patient's noncompliance with other medical treatment and regimen: Secondary | ICD-10-CM | POA: Diagnosis not present

## 2016-09-12 DIAGNOSIS — R45851 Suicidal ideations: Secondary | ICD-10-CM | POA: Diagnosis present

## 2016-09-12 DIAGNOSIS — F429 Obsessive-compulsive disorder, unspecified: Secondary | ICD-10-CM | POA: Diagnosis present

## 2016-09-12 DIAGNOSIS — G4700 Insomnia, unspecified: Secondary | ICD-10-CM | POA: Diagnosis present

## 2016-09-12 DIAGNOSIS — F10939 Alcohol use, unspecified with withdrawal, unspecified: Secondary | ICD-10-CM | POA: Diagnosis present

## 2016-09-12 DIAGNOSIS — F410 Panic disorder [episodic paroxysmal anxiety] without agoraphobia: Secondary | ICD-10-CM | POA: Diagnosis present

## 2016-09-12 DIAGNOSIS — R51 Headache: Secondary | ICD-10-CM | POA: Diagnosis present

## 2016-09-12 DIAGNOSIS — Z9081 Acquired absence of spleen: Secondary | ICD-10-CM

## 2016-09-12 DIAGNOSIS — F17200 Nicotine dependence, unspecified, uncomplicated: Secondary | ICD-10-CM | POA: Diagnosis present

## 2016-09-12 DIAGNOSIS — F1290 Cannabis use, unspecified, uncomplicated: Secondary | ICD-10-CM | POA: Diagnosis present

## 2016-09-12 DIAGNOSIS — F10239 Alcohol dependence with withdrawal, unspecified: Secondary | ICD-10-CM | POA: Diagnosis present

## 2016-09-12 DIAGNOSIS — Z882 Allergy status to sulfonamides status: Secondary | ICD-10-CM

## 2016-09-12 DIAGNOSIS — Z888 Allergy status to other drugs, medicaments and biological substances status: Secondary | ICD-10-CM | POA: Diagnosis not present

## 2016-09-12 DIAGNOSIS — F4310 Post-traumatic stress disorder, unspecified: Secondary | ICD-10-CM | POA: Diagnosis present

## 2016-09-12 DIAGNOSIS — F1220 Cannabis dependence, uncomplicated: Secondary | ICD-10-CM | POA: Diagnosis present

## 2016-09-12 DIAGNOSIS — F411 Generalized anxiety disorder: Secondary | ICD-10-CM | POA: Diagnosis present

## 2016-09-12 DIAGNOSIS — Z79891 Long term (current) use of opiate analgesic: Secondary | ICD-10-CM

## 2016-09-12 DIAGNOSIS — F250 Schizoaffective disorder, bipolar type: Principal | ICD-10-CM | POA: Diagnosis present

## 2016-09-12 MED ORDER — TRAZODONE HCL 100 MG PO TABS
100.0000 mg | ORAL_TABLET | Freq: Every day | ORAL | Status: DC
  Administered 2016-09-12: 100 mg via ORAL
  Filled 2016-09-12: qty 1

## 2016-09-12 MED ORDER — ACETAMINOPHEN 325 MG PO TABS
650.0000 mg | ORAL_TABLET | Freq: Four times a day (QID) | ORAL | Status: DC | PRN
  Administered 2016-09-12 – 2016-09-14 (×5): 650 mg via ORAL
  Filled 2016-09-12 (×5): qty 2

## 2016-09-12 MED ORDER — ACETAMINOPHEN 500 MG PO TABS
1000.0000 mg | ORAL_TABLET | Freq: Three times a day (TID) | ORAL | Status: DC | PRN
  Administered 2016-09-12: 1000 mg via ORAL
  Filled 2016-09-12 (×2): qty 2

## 2016-09-12 MED ORDER — ALUM & MAG HYDROXIDE-SIMETH 200-200-20 MG/5ML PO SUSP
30.0000 mL | ORAL | Status: DC | PRN
  Administered 2016-09-13 – 2016-09-15 (×7): 30 mL via ORAL
  Filled 2016-09-12 (×7): qty 30

## 2016-09-12 MED ORDER — QUETIAPINE FUMARATE 100 MG PO TABS
100.0000 mg | ORAL_TABLET | Freq: Every day | ORAL | Status: DC
  Administered 2016-09-12: 100 mg via ORAL
  Filled 2016-09-12: qty 1

## 2016-09-12 MED ORDER — CHLORDIAZEPOXIDE HCL 25 MG PO CAPS
50.0000 mg | ORAL_CAPSULE | Freq: Four times a day (QID) | ORAL | Status: DC
  Administered 2016-09-12 – 2016-09-15 (×11): 50 mg via ORAL
  Filled 2016-09-12 (×11): qty 2

## 2016-09-12 MED ORDER — MAGNESIUM HYDROXIDE 400 MG/5ML PO SUSP: 30.0000 mL | Freq: Every day | ORAL | Status: DC | PRN

## 2016-09-12 NOTE — Tx Team (Signed)
Initial Treatment Plan 09/12/2016 10:49 PM Patrick [name] [id number]    PATIENT STRESSORS: Financial difficulties Marital or family conflict Medication change or noncompliance Substance abuse   PATIENT STRENGTHS: Ability for insight General fund of knowledge Supportive family/friends   PATIENT IDENTIFIED PROBLEMS:   Alcohol abuse/dependence  Depression  Anxiety  Ineffective coping skills             DISCHARGE CRITERIA:  Adequate post-discharge living arrangements Improved stabilization in mood, thinking, and/or behavior Motivation to continue treatment in a less acute level of care Safe-care adequate arrangements made Verbal commitment to aftercare and medication compliance Withdrawal symptoms are absent or subacute and managed without 24-hour nursing intervention  PRELIMINARY DISCHARGE PLAN: Attend aftercare/continuing care group Attend 12-step recovery group Outpatient therapy  PATIENT/FAMILY INVOLVEMENT: This treatment plan has been presented to and reviewed with the patient, Patrick [name].  The patient and family have been given the opportunity to ask questions and make suggestions.  Patrick [name]  Timithy Arons, RN 09/12/2016, 10:49 PM
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
## 2016-09-12 NOTE — ED Notes (Addendum)
Breakfast brought to patient 

## 2016-09-12 NOTE — ED Notes (Addendum)
Pt approached  nursing station complaining of 'racing thoughts',stating, "the third day is always the hardest when I detox." Pt asking for more medication for 'withdrawal symptoms'.Pt also complaining of back pain. Writer reassured pt that ED Dr will be notified. Pt currently denies SI/HI and A/V hallucinations at this time. Will continue to monitor with 15 minute checks.

## 2016-09-12 NOTE — Consult Note (Signed)
Patrick Psychiatry [name]   Reason for [name]:  Suicidal ideation. Referring Physician: Dr. Archie Balboa Patient Identification: Patrick [name] MRN:  [id number] Principal Diagnosis: <principal problem not specified> Diagnosis:   Patient Active Problem List   Diagnosis Date Noted  . Schizoaffective disorder, bipolar type ([place]) [F25.0] 09/12/2016  . Noncompliance [Z91.19] 08/02/2016  . Suicidal ideation [R45.851] 08/02/2016  . Tobacco use disorder [F17.200] 08/02/2016  . Alcohol withdrawal ([place]) [F10.239] 08/02/2016  . PTSD (post-traumatic stress disorder) [F43.10] 08/02/2016  . Schizoaffective disorder, depressive type ([place]) [F25.1] 08/01/2016  . Cannabis use disorder, moderate, dependence ([place]) [F12.20] 08/01/2016  . Alcohol use disorder, moderate, dependence ([place]) [F10.20] 08/01/2016  . Alcohol use disorder, severe, dependence ([place]) [F10.20] 12/30/2013  . Generalized anxiety disorder [F41.1] 12/30/2012    Total Time spent with patient: 1 hour  Subjective:   Identifying data. Patrick [name] is a 31 year old male with history of schizoaffective disorder and alcoholism.  Chief complaint. "I'm detoxing."  History of present illness. Information was obtained from the patient and the chart. The patient came to the ER complaining of depression and suicidalideation with a plan to overdose in the context of drinking. He has a long history of depression, anxiety, mood instability and substance use. He was recently hospitalized several time at [facility] here, here and in [place] for suicidal ideation in the context of drinking. He was prescribed Zyprexa at one facility and Seroquel and other. Apparently he has not been taking medications for the past week. He became increasingly depressed with poor sleep, decreased appetite, anhedonia, being of guilt and hopelessness worthlessness, poor energy and concentration, social isolation, crying spells, heightened anxiety and suicidal thinking with a  plan to cut himself. This all happened in the context of heavy drinking a case of beer a day. The patient denies psychotic symptoms but reports severe anxiety. He has panic attacks several times a week, nightmares and flashbacks from his so that he suffered 2 years ago, and OCD type of symptoms with excessive worries and organizing. The patient also reports profound mood swings with periods of hyperactivity, insomnia, talkativeness, poor impulse control. In addition to alcohol his been using marijuana. He denies prescription pill use or other substance use at present but was positive for cannabis.  Past psychiatric history. He suffered severe anxiety since childhood and was in therapy when young. He was diagnosed with schizoaffective disorder and has been treated with multiple medications but never any mood stabilizer like Depakote and lithium or Tegretol. He responded well to Zyprexa and Seroquel in the past. There were at least one suicide attempt by hanging. The patient used to cut, last time 2 years ago. 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 He was treated for substance abuse twice in residential setting but did not maintain sobriety for longer than a month. There is a history of alcohol withdrawal seizures. 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 He is no longer going there.  Family psychiatric history. Substance abuse on paternal side. He believes that there were family members with undiagnosed mental illness.  Social history. He graduated from high school and went to [employer] on for a year. He supports himself from a trust fund and does not work at present. He lives with his fianc and a 67-monthold baby. His mother lives nearby.   Risk to Self: Suicidal Ideation: Yes-Currently Present Suicidal Intent: Yes-Currently  Present Is patient at risk for  suicide?: Yes Suicidal Plan?: Yes-Currently Present Specify Current Suicidal Plan: hang self in the woods Access to Means: Yes Specify Access to Suicidal Means: has access to something to hang himself. What has been your use of drugs/alcohol within the last 12 months?: daily alcohol use and occasional cannabis use. How many times?: 0 Other Self Harm Risks: cutting Triggers for Past Attempts: Unknown Intentional Self Injurious Behavior: Cutting Comment - Self Injurious Behavior: past cutting behavior Risk to Others: Homicidal Ideation: No Thoughts of Harm to Others: No Current Homicidal Intent: No Current Homicidal Plan: No Access to Homicidal Means: No Identified Victim: NA History of harm to others?: No Assessment of Violence: None Noted Violent Behavior Description: NA Does patient have access to weapons?: No Criminal Charges Pending?: No Does patient have a court date: No Prior Inpatient Therapy: Prior Inpatient Therapy: Yes Prior Therapy Dates: 07/2016 Prior Therapy Facilty/Provider(s): [facility] Reason for Treatment: detox and suicidal thoughts Prior Outpatient Therapy: Prior Outpatient Therapy: No Prior Therapy Dates: NA Prior Therapy Facilty/Provider(s): NA Reason for Treatment: NA Does patient have an ACCT team?: No Does patient have Intensive In-House Services?  : No Does patient have [facility] services? : No Does patient have P4CC services?: No  Past Medical History:  Past Medical History:  Diagnosis Date  . Generalized anxiety disorder   . Major depressive disorder   . Schizoaffective disorder [facility])     Past Surgical History:  Procedure Laterality Date  . SPLENECTOMY     Family History: History reviewed. No pertinent family history.  Social History:  History  Alcohol Use  . Yes    Comment: Occ     History  Drug Use  . Types: Marijuana    Comment: Occ    Social History   Social History  . Marital status: Single    Spouse name: N/A  . Number of  children: N/A  . Years of education: N/A   Social History Main Topics  . Smoking status: Current Every Day Smoker    Packs/day: 1.00    Types: Cigarettes  . Smokeless tobacco: Never Used  . Alcohol use Yes     Comment: Occ  . Drug use: Yes    Types: Marijuana     Comment: Occ  . Sexual activity: Not Asked   Other Topics Concern  . None   Social History Narrative  . None   Additional Social History:    Allergies:   Allergies  Allergen Reactions  . Sulfa Antibiotics Anaphylaxis  . Antihistamines, Chlorpheniramine-Type Hives  . Diphenhydramine Hcl Palpitations    Labs:  Results for orders placed or performed during the hospital encounter of 09/10/16 (from the past 48 hour(s))  Urine Drug Screen, Qualitative     Status: Abnormal   Collection Time: 09/10/16  6:46 PM  Result Value Ref Range   Tricyclic, Ur Screen NONE DETECTED NONE DETECTED   Amphetamines, Ur Screen NONE DETECTED NONE DETECTED   MDMA (Ecstasy)Ur Screen NONE DETECTED NONE DETECTED   Cocaine Metabolite,Ur [place] NONE DETECTED NONE DETECTED   Opiate, Ur Screen NONE DETECTED NONE DETECTED   Phencyclidine (PCP) Ur S NONE DETECTED NONE DETECTED   Cannabinoid 50 Ng, Ur [place] POSITIVE (A) NONE DETECTED   Barbiturates, Ur Screen NONE DETECTED NONE DETECTED   Benzodiazepine, Ur Scrn NONE DETECTED NONE DETECTED   Methadone Scn, Ur NONE DETECTED NONE DETECTED    Comment: (NOTE) 967  Tricyclics, urine  Cutoff 1000 ng/mL 200  Amphetamines, urine             Cutoff 1000 ng/mL 300  MDMA (Ecstasy), urine           Cutoff 500 ng/mL 400  Cocaine Metabolite, urine       Cutoff 300 ng/mL 500  Opiate, urine                   Cutoff 300 ng/mL 600  Phencyclidine (PCP), urine      Cutoff 25 ng/mL 700  Cannabinoid, urine              Cutoff 50 ng/mL 800  Barbiturates, urine             Cutoff 200 ng/mL 900  Benzodiazepine, urine           Cutoff 200 ng/mL 1000 Methadone, urine                Cutoff 300  ng/mL 1100 1200 The urine drug screen provides only a preliminary, unconfirmed 1300 analytical test result and should not be used for non-medical 1400 purposes. Clinical consideration and professional judgment should 1500 be applied to any positive drug screen result due to possible 1600 interfering substances. A more specific alternate chemical method 1700 must be used in order to obtain a confirmed analytical result.  1800 Gas chromato graphy / mass spectrometry (GC/MS) is the preferred 1900 confirmatory method.   Comprehensive metabolic panel     Status: Abnormal   Collection Time: 09/10/16  6:50 PM  Result Value Ref Range   Sodium 134 (L) 135 - 145 mmol/L   Potassium 3.7 3.5 - 5.1 mmol/L   Chloride 100 (L) 101 - 111 mmol/L   CO2 23 22 - 32 mmol/L   Glucose, Bld 112 (H) 65 - 99 mg/dL   BUN 7 6 - 20 mg/dL   Creatinine, Ser 0.67 0.61 - 1.24 mg/dL   Calcium 9.0 8.9 - 10.3 mg/dL   Total Protein 8.0 6.5 - 8.1 g/dL   Albumin 4.7 3.5 - 5.0 g/dL   AST 47 (H) 15 - 41 U/L   ALT 28 17 - 63 U/L   Alkaline Phosphatase 77 38 - 126 U/L   Total Bilirubin 0.6 0.3 - 1.2 mg/dL   GFR calc non Af Amer >60 >60 mL/min   GFR calc Af Amer >60 >60 mL/min    Comment: (NOTE) The eGFR has been calculated using the CKD EPI equation. This calculation has not been validated in all clinical situations. eGFR's persistently <60 mL/min signify possible Chronic Kidney Disease.    Anion gap 11 5 - 15  Ethanol     Status: Abnormal   Collection Time: 09/10/16  6:50 PM  Result Value Ref Range   Alcohol, Ethyl (B) 200 (H) <5 mg/dL    Comment:        LOWEST DETECTABLE LIMIT FOR SERUM ALCOHOL IS 5 mg/dL FOR MEDICAL PURPOSES ONLY   Salicylate level     Status: None   Collection Time: 09/10/16  6:50 PM  Result Value Ref Range   Salicylate Lvl <4.9 2.8 - 30.0 mg/dL  Acetaminophen level     Status: Abnormal   Collection Time: 09/10/16  6:50 PM  Result Value Ref Range   Acetaminophen (Tylenol), Serum <10 (L)  10 - 30 ug/mL    Comment:        THERAPEUTIC CONCENTRATIONS VARY SIGNIFICANTLY. A RANGE OF 10-30 ug/mL MAY BE AN EFFECTIVE CONCENTRATION  FOR MANY PATIENTS. HOWEVER, SOME ARE BEST TREATED AT CONCENTRATIONS OUTSIDE THIS RANGE. ACETAMINOPHEN CONCENTRATIONS >150 ug/mL AT 4 HOURS AFTER INGESTION AND >50 ug/mL AT 12 HOURS AFTER INGESTION ARE OFTEN ASSOCIATED WITH TOXIC REACTIONS.   cbc     Status: None   Collection Time: 09/10/16  6:50 PM  Result Value Ref Range   WBC 7.0 3.8 - 10.6 K/uL   RBC 4.95 4.40 - 5.90 MIL/uL   Hemoglobin 14.6 13.0 - 18.0 g/dL   HCT 42.8 40.0 - 52.0 %   MCV 86.4 80.0 - 100.0 fL   MCH 29.5 26.0 - 34.0 pg   MCHC 34.2 32.0 - 36.0 g/dL   RDW 13.8 11.5 - 14.5 %   Platelets 253 150 - 440 K/uL    Current Facility-Administered Medications  Medication Dose Route Frequency Provider Last Rate Last Dose  . acetaminophen (TYLENOL) tablet 1,000 mg  1,000 mg Oral TID PRN Earleen Newport, MD   1,000 mg at 09/12/16 1537  . folic acid (FOLVITE) tablet 1 mg  1 mg Oral Daily Carrie Mew, MD   1 mg at 09/12/16 0901  . LORazepam (ATIVAN) injection 0-4 mg  0-4 mg Intravenous Q6H Carrie Mew, MD       Or  . LORazepam (ATIVAN) tablet 0-4 mg  0-4 mg Oral Q6H Carrie Mew, MD   2 mg at 09/12/16 1421  . [START ON 09/13/2016] LORazepam (ATIVAN) injection 0-4 mg  0-4 mg Intravenous Flo Shanks, MD       Or  . Derrill Memo ON 09/13/2016] LORazepam (ATIVAN) tablet 0-4 mg  0-4 mg Oral Q12H Carrie Mew, MD      . multivitamin with minerals tablet 1 tablet  1 tablet Oral Daily Carrie Mew, MD   1 tablet at 09/12/16 0901  . nicotine polacrilex (NICORETTE) gum 2 mg  2 mg Oral PRN Lisa Roca, MD   2 mg at 09/12/16 1450  . thiamine (VITAMIN B-1) tablet 100 mg  100 mg Oral Daily Carrie Mew, MD   100 mg at 09/12/16 0901  . traZODone (DESYREL) tablet 50 mg  50 mg Oral QHS PRN Harvest Dark, MD       Current Outpatient Prescriptions   Medication Sig Dispense Refill  . buprenorphine-naloxone (SUBOXONE) 8-2 mg SUBL SL tablet Place 2 tablets under the tongue daily.      Musculoskeletal: Strength & Muscle Tone: within normal limits Gait & Station: normal Patient leans: N/A  Psychiatric Specialty Exam: Physical Exam  Nursing note and vitals reviewed. Constitutional: He is oriented to person, place, and time. He appears well-developed and well-nourished.  HENT:  Head: Normocephalic and atraumatic.  Eyes: Conjunctivae and EOM are normal. Pupils are equal, round, and reactive to light.  Neck: Normal range of motion. Neck supple.  Cardiovascular: Normal rate, regular rhythm and normal heart sounds.   Respiratory: Effort normal and breath sounds normal.  GI: Soft. Bowel sounds are normal.  Musculoskeletal: Normal range of motion.  Neurological: He is alert and oriented to person, place, and time.  Skin: Skin is warm and dry.  Psychiatric: His speech is normal. His mood appears anxious. His affect is angry. He is slowed and withdrawn. Cognition and memory are normal. He expresses impulsivity. He exhibits a depressed mood. He expresses suicidal ideation. He expresses suicidal plans.    Review of Systems  Neurological: Positive for tremors.  Psychiatric/Behavioral: Positive for depression, substance abuse and suicidal ideas.  All other systems reviewed and are negative.   Blood pressure Marland Kitchen)  141/84, pulse 84, temperature 98.4 F (36.9 C), temperature source Oral, resp. rate 16, height 5' 10"  (1.778 m), weight 81.6 kg (180 lb), SpO2 98 %.Body mass index is 25.83 kg/m.  General Appearance: Disheveled  Eye Contact:  Poor  Speech:  Clear and Coherent  Volume:  Decreased  Mood:  Anxious and Depressed  Affect:  Blunt  Thought Process:  Goal Directed and Descriptions of Associations: Intact  Orientation:  Full (Time, Place, and Person)  Thought Content:  WDL  Suicidal Thoughts:  Yes.  with intent/plan  Homicidal Thoughts:   No  Memory:  Immediate;   Fair Recent;   Fair Remote;   Fair  Judgement:  Poor  Insight:  Lacking  Psychomotor Activity:  Psychomotor Retardation  Concentration:  Concentration: Good and Attention Span: Fair  Recall:  [employer] of Knowledge:  Fair  Language:  Fair  Akathisia:  No  Handed:  Right  AIMS (if indicated):     Assets:  Communication Skills Desire for Improvement Financial Resources/Insurance Housing Intimacy Physical Health Resilience Social Support  ADL's:  Intact  Cognition:  WNL  Sleep:        Treatment Plan Summary: Daily contact with patient to assess and evaluate symptoms and progress in treatment and Medication management   PLAN: 1. Will admit to psychiatry.  2. Please continue detox protocol until transfer.  Disposition: Recommend psychiatric Inpatient admission when medically cleared. Supportive therapy provided about ongoing stressors. Discussed crisis plan, support from social network, calling 911, coming to the Emergency Department, and calling Suicide Hotline.  Orson Slick, MD 09/12/2016 6:28 PM

## 2016-09-12 NOTE — Progress Notes (Signed)
[facility] has been [facility].  Patient [facility] to room 306A by [facility] Charge RN, Jamesetta SoPhyllis Accepting physician is Dr. Jennet MaduroPucilowska Admitting physician is Dr. Jennet MaduroPucilowska Call report to [phone]  Called Patient's bedside nurse , expressed patient has been admitted, gave charge nurse number [phone] to call report once [facility]. [facility] awaiting orders per Consulting civil engineerCharge RN.    Enos FlingAshley Lida Berkery, MSW, LCSW [facility] Clinical Social Worker [phone]

## 2016-09-12 NOTE — ED Notes (Signed)

## 2016-09-12 NOTE — ED Notes (Signed)
Pt. Alert and oriented, warm and dry, in no distress. Pt. Denies SI, HI, and AVH. Pt states he was having some VH earlier in the day but since gone. Pt. Encouraged to let nursing staff know of any concerns or needs.

## 2016-09-12 NOTE — ED Provider Notes (Signed)
Vitals:   09/11/16 1920 09/12/16 0639  BP: 129/80 125/80  Pulse: 70 73  Resp: 18 16  Temp: 98.4 F (36.9 C) 98.3 F (36.8 C)   Patient remains medically stable for psychiatric disposition   Emily FilbertWilliams, Dimitris Shanahan E, MD 09/12/16 [phone]

## 2016-09-12 NOTE — ED Notes (Signed)
Lunch brought to patient 

## 2016-09-13 DIAGNOSIS — F250 Schizoaffective disorder, bipolar type: Principal | ICD-10-CM

## 2016-09-13 MED ORDER — NICOTINE 21 MG/24HR TD PT24
21.0000 mg | MEDICATED_PATCH | Freq: Every day | TRANSDERMAL | Status: DC
  Administered 2016-09-13 – 2016-09-15 (×2): 21 mg via TRANSDERMAL
  Filled 2016-09-13 (×2): qty 1

## 2016-09-13 MED ORDER — QUETIAPINE FUMARATE 100 MG PO TABS
100.0000 mg | ORAL_TABLET | Freq: Three times a day (TID) | ORAL | Status: DC
  Administered 2016-09-13 – 2016-09-15 (×6): 100 mg via ORAL
  Filled 2016-09-13 (×6): qty 1

## 2016-09-13 MED ORDER — QUETIAPINE FUMARATE 200 MG PO TABS
200.0000 mg | ORAL_TABLET | Freq: Every day | ORAL | Status: DC
  Administered 2016-09-13 – 2016-09-14 (×2): 200 mg via ORAL
  Filled 2016-09-13 (×2): qty 1

## 2016-09-13 MED ORDER — TRAZODONE HCL 100 MG PO TABS
100.0000 mg | ORAL_TABLET | Freq: Every evening | ORAL | Status: DC | PRN
  Filled 2016-09-13: qty 1

## 2016-09-13 MED ORDER — FLUVOXAMINE MALEATE 50 MG PO TABS
50.0000 mg | ORAL_TABLET | Freq: Every day | ORAL | Status: DC
  Administered 2016-09-13: 50 mg via ORAL
  Filled 2016-09-13: qty 1

## 2016-09-13 MED ORDER — QUETIAPINE FUMARATE 100 MG PO TABS: 100.0000 mg | ORAL_TABLET | Freq: Two times a day (BID) | ORAL | Status: DC

## 2016-09-13 NOTE — Plan of Care (Signed)
Problem: Safety: Goal: Ability to remain free from injury will improve Outcome: Progressing Patient remains free from injury on the unit.

## 2016-09-13 NOTE — H&P (Signed)
Psychiatric Admission Assessment Adult  Patient Identification: Patrick [name] MRN:  [id number] Date of Evaluation:  09/13/2016 Chief Complaint:  Schizoaffective Principal Diagnosis: <principal problem not specified> Diagnosis:   Patient Active Problem List   Diagnosis Date Noted  . Schizoaffective disorder, bipolar type (HCC) [F25.0] 09/12/2016  . Noncompliance [Z91.19] 08/02/2016  . Suicidal ideation [R45.851] 08/02/2016  . Tobacco use disorder [F17.200] 08/02/2016  . Alcohol withdrawal (HCC) [F10.239] 08/02/2016  . PTSD (post-traumatic stress disorder) [F43.10] 08/02/2016  . Schizoaffective disorder, depressive type (HCC) [F25.1] 08/01/2016  . Cannabis use disorder, moderate, dependence (HCC) [F12.20] 08/01/2016  . Alcohol use disorder, moderate, dependence (HCC) [F10.20] 08/01/2016  . Alcohol use disorder, severe, dependence (HCC) [F10.20] 12/30/2013  . Generalized anxiety disorder [F41.1] 12/30/2012   History of Present Illness:   Identifying data. [name] is a 31 year old male with history of schizoaffective disorder and alcoholism.  Chief complaint. "I'm detoxing."  History of present illness. Information was obtained from the patient and the chart. The patient came to the ER complaining of depression and suicidalideation with a plan to overdose in the context of drinking. He has a long history of depression, anxiety, mood instability and substance use. He was recently hospitalized several time at [facility] here, here and in [place] for suicidal ideation in the context of drinking. He was prescribed Zyprexa at one facility and Seroquel and other. Apparently he has not been taking medications for the past week. He became increasingly depressed with poor sleep, decreased appetite, anhedonia, being of guilt and hopelessness worthlessness, poor energy and concentration, social isolation, crying spells, heightened anxiety and suicidal thinking with a plan to cut himself. This all  happened in the context of heavy drinking a case of beer a day. The patient denies psychotic symptoms but reports severe anxiety. He has panic attacks several times a week, nightmares and flashbacks from his so that he suffered 2 years ago, and OCD type of symptoms with excessive worries and organizing. The patient also reports profound mood swings with periods of hyperactivity, insomnia, talkativeness, poor impulse control. In addition to alcohol his been using marijuana. He denies prescription pill use or other substance use at present but was positive for cannabis.  Past psychiatric history. He suffered severe anxiety since childhood and was in therapy when young. He was diagnosed with schizoaffective disorder and has been treated with multiple medications but never any mood stabilizer like Depakote and lithium or Tegretol. He responded well to Zyprexa and Seroquel in the past. There were at least one suicide attempt by hanging. The patient used to cut, last time 2 years ago. 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 He was treated for substance abuse twice in residential setting but did not maintain sobriety for longer than a month. There is a history of alcohol withdrawal seizures. 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 He is no longer going there.  Family psychiatric history. Substance abuse on paternal side. He believes that there were family members with undiagnosed mental illness.  Social history. He graduated from high school and went to [employer] on for a year. He supports himself from a trust fund and does not work at present. He lives with his fianc and a 55-month-old baby. His mother lives nearby.  Total Time spent with patient: 1 hour  Is the patient at risk to self? Yes.  Has the patient been a risk to self in the past 6 months? Yes.    Has  the patient been a risk to self within the distant past? Yes.    Is the patient a risk to others? No.  Has the patient been a risk to others in the past 6 months? No.  Has the patient been a risk to others within the distant past? No.   Prior Inpatient Therapy:   Prior Outpatient Therapy:    Alcohol Screening: 1. How often do you have a drink containing alcohol?: 4 or more times a week 2. How many drinks containing alcohol do you have on a typical day when you are drinking?: 10 or more 3. How often do you have six or more drinks on one occasion?: Daily or almost daily Preliminary Score: 8 4. How often during the last year have you found that you were not able to stop drinking once you had started?: Daily or almost daily 5. How often during the last year have you failed to do what was normally expected from you becasue of drinking?: Daily or almost daily 6. How often during the last year have you needed a first drink in the morning to get yourself going after a heavy drinking session?: Daily or almost daily 7. How often during the last year have you had a feeling of guilt of remorse after drinking?: Daily or almost daily 8. How often during the last year have you been unable to remember what happened the night before because you had been drinking?: Daily or almost daily 9. Have you or someone else been injured as a result of your drinking?: Yes, during the last year 10. Has a relative or friend or a doctor or another health worker been concerned about your drinking or suggested you cut down?: Yes, during the last year Alcohol Use Disorder Identification Test Final Score (AUDIT): 40 Brief Intervention: Yes Substance Abuse History in the last 12 months:  Yes.   Consequences of Substance Abuse: Negative Previous Psychotropic Medications: Yes. Psychological Evaluations: No  Past Medical History:  Past Medical History:  Diagnosis Date  . Generalized anxiety disorder   . Major depressive  disorder   . Schizoaffective disorder [facility])     Past Surgical History:  Procedure Laterality Date  . SPLENECTOMY     Family History: History reviewed. No pertinent family history.  Tobacco Screening:   Social History:  History  Alcohol Use  . Yes    Comment: Occ     History  Drug Use  . Types: Marijuana    Comment: long ago pain    Additional Social History:      Pain Medications: Hx of Prescriptions: hx of History of alcohol / drug use?: Yes Longest period of sobriety (when/how long): 31m month Negative Consequences of Use: Financial, Legal, Personal relationships Withdrawal Symptoms: Agitation, DTs, Irritability, Nausea / Vomiting, Patient aware of relationship between substance abuse and physical/medical complications, Sweats, Tremors, Tachycardia                    Allergies:   Allergies  Allergen Reactions  . Sulfa Antibiotics Anaphylaxis  . Antihistamines, Chlorpheniramine-Type Hives  . Diphenhydramine Hcl Palpitations   Lab Results: No results found for this or any previous visit (from the past 48 hour(s)).  Blood Alcohol level:  Lab Results  Component Value Date   ETH 200 (H) 09/10/2016   ETH 94 (H) 07/30/2016    Metabolic Disorder Labs:  Lab  Results  Component Value Date   HGBA1C 5.0 08/02/2016   MPG 97 08/02/2016   Lab Results  Component Value Date   PROLACTIN 21.0 (H) 08/02/2016   Lab Results  Component Value Date   CHOL 208 (H) 08/02/2016   TRIG 153 (H) 08/02/2016   HDL 63 08/02/2016   CHOLHDL 3.3 08/02/2016   VLDL 31 08/02/2016   LDLCALC 114 (H) 08/02/2016    Current Medications: Current Facility-Administered Medications  Medication Dose Route Frequency Provider Last Rate Last Dose  . acetaminophen (TYLENOL) tablet 650 mg  650 mg Oral Q6H PRN Pucilowska, Jolanta B, MD   650 mg at 09/13/16 0820  . alum & mag hydroxide-simeth (MAALOX/MYLANTA) 200-200-20 MG/5ML suspension 30 mL  30 mL Oral Q4H PRN Pucilowska, Jolanta B, MD       . chlordiazePOXIDE (LIBRIUM) capsule 50 mg  50 mg Oral QID Pucilowska, Jolanta B, MD   50 mg at 09/13/16 0757  . fluvoxaMINE (LUVOX) tablet 50 mg  50 mg Oral QHS Pucilowska, Jolanta B, MD      . magnesium hydroxide (MILK OF MAGNESIA) suspension 30 mL  30 mL Oral Daily PRN Pucilowska, Jolanta B, MD      . nicotine (NICODERM CQ - dosed in mg/24 hours) patch 21 mg  21 mg Transdermal Daily Pucilowska, Jolanta B, MD   21 mg at 09/13/16 0820  . QUEtiapine (SEROQUEL) tablet 100 mg  100 mg Oral BID AC Pucilowska, Jolanta B, MD      . QUEtiapine (SEROQUEL) tablet 200 mg  200 mg Oral QHS Pucilowska, Jolanta B, MD      . traZODone (DESYREL) tablet 100 mg  100 mg Oral QHS PRN Pucilowska, Jolanta B, MD       PTA Medications: Prescriptions Prior to Admission  Medication Sig Dispense Refill Last Dose  . buprenorphine-naloxone (SUBOXONE) 8-2 mg SUBL SL tablet Place 2 tablets under the tongue daily.   Not Taking at Unknown time    Musculoskeletal: Strength & Muscle Tone: within normal limits Gait & Station: normal Patient leans: N/A  Psychiatric Specialty Exam: Physical Exam  Nursing note and vitals reviewed. Constitutional: He is oriented to person, place, and time. He appears well-developed and well-nourished.  HENT:  Head: Normocephalic and atraumatic.  Eyes: Conjunctivae and EOM are normal. Pupils are equal, round, and reactive to light.  Neck: Normal range of motion. Neck supple.  Cardiovascular: Normal rate, regular rhythm and normal heart sounds.   Respiratory: Effort normal and breath sounds normal.  GI: Soft. Bowel sounds are normal.  Musculoskeletal: Normal range of motion.  Neurological: He is alert and oriented to person, place, and time.  Skin: Skin is warm and dry.  Psychiatric: His speech is normal. His affect is blunt. He is withdrawn. Cognition and memory are normal. He expresses impulsivity. He exhibits a depressed mood. He expresses suicidal ideation. He expresses suicidal plans.     Review of Systems  Neurological: Positive for tremors and headaches.  Psychiatric/Behavioral: Positive for depression, substance abuse and suicidal ideas.  All other systems reviewed and are negative.   Blood pressure 130/73, pulse (!) 102, temperature 98.9 F (37.2 C), temperature source Oral, resp. rate 16, height 5\' 10"  (1.778 m), weight 83.9 kg (185 lb), SpO2 100 %.Body mass index is 26.54 kg/m.  See SRA.  Sleep:  Number of Hours: 7.25    Treatment Plan Summary: Daily contact with patient to assess and evaluate symptoms and progress in treatment and Medication management   Mr. Freimark is a 31 year old male with history of depression, anxiety, mood instability and alcoholism admitted for suicidal ideation with a plan to overdose in the context of treatment noncompliance and drinking.  1. Suicidal ideation. The patient is able to contract for safety in the hospital.    2. Mood. He requested Seroquel for depression and mood stabilization.   3. Alcohol detox. We started librium taper. Vital signs are stable.  4. Anxiety. We started Luvox for OCD type symptoms.   5. Insomnia. Trazodone is available.    6. Substance abuse treatment. The patient is not interested in pharmacological treatment of alcoholism or residential treatment.  7. Smoking. Nicotine patch is available.  8. Metabolic syndrome monitoring. Lipid panel, TSH and Hemoglobin A1c were normal during recent hospitalization.    9. EKG. Sinus tachycardia, QTc 428.  10. Headache. Tylenol is available.  11. Disposition. He will be discharged to home with family. He will follow up with [facility].   Observation Level/Precautions:  15 minute checks  Laboratory:  CBC Chemistry Profile UDS UA  Psychotherapy:    Medications:    Consultations:    Discharge Concerns:    Estimated LOS:  Other:     Physician Treatment Plan for Primary  Diagnosis: <principal problem not specified> Long Term Goal(s): Improvement in symptoms so as ready for discharge  Short Term Goals: Ability to identify changes in lifestyle to reduce recurrence of condition will improve, Ability to verbalize feelings will improve, Ability to disclose and discuss suicidal ideas, Compliance with prescribed medications will improve and Ability to identify triggers associated with substance abuse/mental health issues will improve  Physician Treatment Plan for Secondary Diagnosis: Active Problems:   Schizoaffective disorder, bipolar type (HCC)  Long Term Goal(s): Improvement in symptoms so as ready for discharge  Short Term Goals: Ability to identify changes in lifestyle to reduce recurrence of condition will improve, Ability to demonstrate self-control will improve and Ability to identify triggers associated with substance abuse/mental health issues will improve  I certify that inpatient services furnished can reasonably be expected to improve the patient's condition.    Kristine Linea, MD 6/26/20188:23 AM

## 2016-09-13 NOTE — [facility] Group Notes (Signed)
[facility] LCSW Group Therapy  09/13/2016 5:01 PM  Goals Group Date/Time: 09/13/2016 9:00 AM Type of Therapy and Topic: Group Therapy: Goals Group: SMART Goals   Participation Level: Moderate  Description of Group:    The purpose of a daily goals group is to assist and guide patients in setting recovery/wellness-related goals. The objective is to set goals as they relate to the crisis in which they were admitted. Patients will be using SMART goal modalities to set measurable goals. Characteristics of realistic goals will be discussed and patients will be assisted in setting and processing how one will reach their goal. Facilitator will also assist patients in applying interventions and coping skills learned in psycho-education groups to the SMART goal and process how one will achieve defined goal.   Therapeutic Goals:   -Patients will develop and document one goal related to or their crisis in which brought them into treatment.  -Patients will be guided by LCSW using SMART goal setting modality in how to set a measurable, attainable, realistic and time sensitive goal.  -Patients will process barriers in reaching goal.  -Patients will process interventions in how to overcome and successful in reaching goal.   Patient's Goal: "feel better by taking my medications like I'm supposed to"   Therapeutic Modalities:  Motivational Interviewing  Cognitive Behavioral Therapy  Crisis Intervention Model  SMART goals setting   [name], LCSW 09/13/2016, 5:01 PM

## 2016-09-13 NOTE — Progress Notes (Signed)
Continued from previous note: symptoms. Affect flat, mood is irritable. Compliant with meals and encouraged to attend group sessions. Patient will be monitored with q 15 minute safety checks.

## 2016-09-13 NOTE — Progress Notes (Signed)
Recreation Therapy Notes  Date: 06.26.18 Time: 9:30 am Location: Craft Room  Group Topic: Goal Setting  Goal Area(s) Addresses:  Patient will write at least one goal. Patient will write at least one obstacle.  Behavioral Response: Attentive, Interactive  Intervention: Recovery Goal Chart  Activity: Patients were instructed to make a Recovery Goal Chart including their goals, obstacles, the date they started working on their goals, and the date they achieved their goals.  Education: LRT educated patients on healthy ways to celebrate reaching their goals.  Education Outcome: Acknowledges education/In group clarification offered  Clinical Observations/Feedback: Patient wrote goals and obstacles. Patient contributed to group discussion by stating what he can do to celebrate reaching his goals in a healthy way.  Jacquelynn CreeGreene,Aluel Schwarz M, LRT/CTRS 09/13/2016 10:25 AM

## 2016-09-13 NOTE — Progress Notes (Signed)
Patient up ad lib with steady gait. A&O x 4, complains of increased withdrawal symptoms. CIWA score 8 upon assessment. Noted to be pacing the halls and states, "I just can't sit still, I'm better off just walking." Denies SI/HI AVH but states that sound was unusually loud to him, relating this to his withdrawal.

## 2016-09-13 NOTE — [facility] Group Notes (Signed)
[facility] LCSW Group Therapy Note  Date/Time: 09/13/2016; 3:00PM  Type of Therapy/Topic:  Group Therapy:  Feelings about Diagnosis  Participation Level:  Did Not Attend     Description of Group:    This group will allow patients to explore their thoughts and feelings about diagnoses they have received. Patients will be guided to explore their level of understanding and acceptance of these diagnoses. Facilitator will encourage patients to process their thoughts and feelings about the reactions of others to their diagnosis, and will guide patients in identifying ways to discuss their diagnosis with significant others in their lives. This group will be process-oriented, with patients participating in exploration of their own experiences as well as giving and receiving support and challenge from other group members.   Therapeutic Goals: 1. Patient will demonstrate understanding of diagnosis as evidence by identifying two or more symptoms of the disorder:  2. Patient will be able to express two feelings regarding the diagnosis 3. Patient will demonstrate ability to communicate their needs through discussion and/or role plays    Therapeutic Modalities:   Cognitive Behavioral Therapy Brief Therapy Feelings Identification   Patrick AbbotKadijah Madalynne [name], MSW, LCSW-A 09/13/2016, 3:55PM

## 2016-09-13 NOTE — Progress Notes (Signed)
Recreation Therapy Notes  At approximately 1:35 pm, LRT attempted assessment. Patient reported he wanted to sleep.  Jacquelynn CreeGreene,Gertrude Tarbet M, LRT/CTRS 09/13/2016 2:13 PM

## 2016-09-13 NOTE — Progress Notes (Signed)
Pt appeared to have slept 7.25 hours while monitored on 15 minute safety checks.

## 2016-09-13 NOTE — [facility] Suicide Risk Assessment (Signed)
[facility]   Nursing information obtained from:    Demographic factors:    Current Mental Status:    Loss Factors:    Historical Factors:    Risk Reduction Factors:     Total Time spent with patient: 1 hour Principal Problem: <principal problem not specified> Diagnosis:   Patient Active Problem List   Diagnosis Date Noted  . Schizoaffective disorder, bipolar type (HCC) [F25.0] 09/12/2016  . Noncompliance [Z91.19] 08/02/2016  . Suicidal ideation [R45.851] 08/02/2016  . Tobacco use disorder [F17.200] 08/02/2016  . Alcohol withdrawal (HCC) [F10.239] 08/02/2016  . PTSD (post-traumatic stress disorder) [F43.10] 08/02/2016  . Schizoaffective disorder, depressive type (HCC) [F25.1] 08/01/2016  . Cannabis use disorder, moderate, dependence (HCC) [F12.20] 08/01/2016  . Alcohol use disorder, moderate, dependence (HCC) [F10.20] 08/01/2016  . Alcohol use disorder, severe, dependence (HCC) [F10.20] 12/30/2013  . Generalized anxiety disorder [F41.1] 12/30/2012   Subjective Data: suicidal ideation, alcoholism.  Continued Clinical Symptoms:  Alcohol Use Disorder Identification Test Final Score (AUDIT): 40 The "Alcohol Use Disorders Identification Test", Guidelines for Use in Primary Care, Second Edition.  World Science writerHealth Organization [facility](WHO). Score between 0-7:  no or low risk or alcohol related problems. Score between 8-15:  moderate risk of alcohol related problems. Score between 16-19:  high risk of alcohol related problems. Score 20 or above:  warrants further diagnostic evaluation for alcohol dependence and treatment.   CLINICAL FACTORS:   Bipolar Disorder:   Depressive phase Alcohol/Substance Abuse/Dependencies   Musculoskeletal: Strength & Muscle Tone: within normal limits Gait & Station: normal Patient leans: N/A  Psychiatric Specialty Exam: Physical Exam  Nursing note and vitals reviewed. Psychiatric: His speech is normal and behavior is normal. His  affect is blunt. He expresses impulsivity. He exhibits a depressed mood. He expresses suicidal ideation. He expresses suicidal plans.    Review of Systems  Psychiatric/Behavioral: Positive for depression, substance abuse and suicidal ideas.  All other systems reviewed and are negative.   Blood pressure 130/73, pulse (!) 102, temperature 98.9 F (37.2 C), temperature source Oral, resp. rate 16, height 5\' 10"  (1.778 m), weight 83.9 kg (185 lb), SpO2 100 %.Body mass index is 26.54 kg/m.  General Appearance: Disheveled  Eye Contact:  Good  Speech:  Clear and Coherent  Volume:  Decreased  Mood:  Depressed and Hopeless  Affect:  Blunt  Thought Process:  Goal Directed and Descriptions of Associations: Intact  Orientation:  Full (Time, Place, and Person)  Thought Content:  WDL  Suicidal Thoughts:  Yes.  with intent/plan  Homicidal Thoughts:  No  Memory:  Immediate;   Fair Recent;   Fair Remote;   Fair  Judgement:  Poor  Insight:  Lacking  Psychomotor Activity:  Psychomotor Retardation  Concentration:  Concentration: Fair and Attention Span: Fair  Recall:  [employer]Fair  Fund of Knowledge:  Fair  Language:  Fair  Akathisia:  No  Handed:  Right  AIMS (if indicated):     Assets:  Communication Skills Desire for Improvement Financial Resources/Insurance Housing Intimacy Physical Health Resilience Social Support Transportation Vocational/Educational  ADL's:  Intact  Cognition:  WNL  Sleep:  Number of Hours: 7.25      COGNITIVE FEATURES THAT CONTRIBUTE TO RISK:  None    SUICIDE RISK:   Moderate:  Frequent suicidal ideation with limited intensity, and duration, some specificity in terms of plans, no associated intent, good self-control, limited dysphoria/symptomatology, some risk factors present, and identifiable protective factors, including available and accessible social support.  PLAN OF CARE: hospital admission, medication management, substance abuse counseling, discharge  planning.   Patrick [name] is a 31 year old male with history of depression, anxiety, mood instability and alcoholism admitted for suicidal ideation with a plan to overdose in the context of treatment noncompliance and drinking.  1. Suicidal ideation. The patient is able to contract for safety in the hospital.    2. Mood. He requested Seroquel for depression and mood stabilization.   3. Alcohol detox. We started librium taper. Vital signs are stable.  4. Anxiety. We started Luvox for OCD type symptoms.   5. Insomnia. Trazodone is available.    6. Substance abuse treatment. The patient is not interested in pharmacological treatment of alcoholism or residential treatment.  7. Smoking. Nicotine patch is available.  8. Metabolic syndrome monitoring. Lipid panel, TSH and Hemoglobin A1c were normal during recent hospitalization.    9. EKG. Sinus tachycardia, QTc 428.  10. Headache. Tylenol is available.  11. Disposition. He will be discharged to home with family. He will follow up with [facility].   I certify that inpatient services furnished can reasonably be expected to improve the patient's condition.   Kristine Linea, MD 09/13/2016, 8:14 AM

## 2016-09-14 MED ORDER — QUETIAPINE FUMARATE 100 MG PO TABS: 100.0000 mg | ORAL_TABLET | Freq: Three times a day (TID) | ORAL | 1 refills | Status: DC

## 2016-09-14 MED ORDER — QUETIAPINE FUMARATE 200 MG PO TABS: 200.0000 mg | ORAL_TABLET | Freq: Every day | ORAL | 1 refills | Status: DC

## 2016-09-14 MED ORDER — QUETIAPINE FUMARATE 100 MG PO TABS
100.0000 mg | ORAL_TABLET | Freq: Three times a day (TID) | ORAL | 1 refills | Status: DC
Start: 2016-09-14 — End: 2017-04-11

## 2016-09-14 NOTE — Tx Team (Signed)
Interdisciplinary Treatment and Diagnostic Plan Update  09/14/2016 Time of Session: 1045 Patrick [name] MRN: [id number]  Principal Diagnosis: Schizoaffective disorder, bipolar type (HCC)  Secondary Diagnoses: Principal Problem:   Schizoaffective disorder, bipolar type (HCC) Active Problems:   Cannabis use disorder, moderate, dependence (HCC)   Alcohol use disorder, moderate, dependence (HCC)   Suicidal ideation   Tobacco use disorder   Alcohol withdrawal (HCC)   PTSD (post-traumatic stress disorder)   Generalized anxiety disorder   Alcohol use disorder, severe, dependence (HCC)   Current Medications:  Current Facility-Administered Medications  Medication Dose Route Frequency Provider Last Rate Last Dose  . acetaminophen (TYLENOL) tablet 650 mg  650 mg Oral Q6H PRN Pucilowska, Jolanta B, MD   650 mg at 09/14/16 0821  . alum & mag hydroxide-simeth (MAALOX/MYLANTA) 200-200-20 MG/5ML suspension 30 mL  30 mL Oral Q4H PRN Pucilowska, Jolanta B, MD   30 mL at 09/14/16 1040  . chlordiazePOXIDE (LIBRIUM) capsule 50 mg  50 mg Oral QID Pucilowska, Jolanta B, MD   50 mg at 09/14/16 0818  . magnesium hydroxide (MILK OF MAGNESIA) suspension 30 mL  30 mL Oral Daily PRN Pucilowska, Jolanta B, MD      . nicotine (NICODERM CQ - dosed in mg/24 hours) patch 21 mg  21 mg Transdermal Daily Pucilowska, Jolanta B, MD   21 mg at 09/13/16 0820  . QUEtiapine (SEROQUEL) tablet 100 mg  100 mg Oral TID Pucilowska, Jolanta B, MD   100 mg at 09/14/16 0818  . QUEtiapine (SEROQUEL) tablet 200 mg  200 mg Oral QHS Pucilowska, Jolanta B, MD   200 mg at 09/13/16 2107  . traZODone (DESYREL) tablet 100 mg  100 mg Oral QHS PRN Pucilowska, Jolanta B, MD       PTA Medications: Prescriptions Prior to Admission  Medication Sig Dispense Refill Last Dose  . buprenorphine-naloxone (SUBOXONE) 8-2 mg SUBL SL tablet Place 2 tablets under the tongue daily.   Not Taking at Unknown time    Patient Stressors: Financial  difficulties Marital or family conflict Medication change or noncompliance Substance abuse  Patient Strengths: Ability for insight General fund of knowledge Supportive family/friends  Treatment Modalities: Medication Management, Group therapy, Case management,  1 to 1 session with clinician, Psychoeducation, Recreational therapy.   Physician Treatment Plan for Primary Diagnosis: Schizoaffective disorder, bipolar type (HCC) Long Term Goal(s): Improvement in symptoms so as ready for discharge Improvement in symptoms so as ready for discharge   Short Term Goals: Ability to identify changes in lifestyle to reduce recurrence of condition will improve Ability to verbalize feelings will improve Ability to disclose and discuss suicidal ideas Compliance with prescribed medications will improve Ability to identify triggers associated with substance abuse/mental health issues will improve Ability to identify changes in lifestyle to reduce recurrence of condition will improve Ability to demonstrate self-control will improve Ability to identify triggers associated with substance abuse/mental health issues will improve  Medication Management: Evaluate patient's response, side effects, and tolerance of medication regimen.  Therapeutic Interventions: 1 to 1 sessions, Unit Group sessions and Medication administration.  Evaluation of Outcomes: Progressing  Physician Treatment Plan for Secondary Diagnosis: Principal Problem:   Schizoaffective disorder, bipolar type (HCC) Active Problems:   Cannabis use disorder, moderate, dependence (HCC)   Alcohol use disorder, moderate, dependence (HCC)   Suicidal ideation   Tobacco use disorder   Alcohol withdrawal (HCC)   PTSD (post-traumatic stress disorder)   Generalized anxiety disorder   Alcohol use disorder, severe, dependence (HCC)  Long  Term Goal(s): Improvement in symptoms so as ready for discharge Improvement in symptoms so as ready for  discharge   Short Term Goals: Ability to identify changes in lifestyle to reduce recurrence of condition will improve Ability to verbalize feelings will improve Ability to disclose and discuss suicidal ideas Compliance with prescribed medications will improve Ability to identify triggers associated with substance abuse/mental health issues will improve Ability to identify changes in lifestyle to reduce recurrence of condition will improve Ability to demonstrate self-control will improve Ability to identify triggers associated with substance abuse/mental health issues will improve     Medication Management: Evaluate patient's response, side effects, and tolerance of medication regimen.  Therapeutic Interventions: 1 to 1 sessions, Unit Group sessions and Medication administration.  Evaluation of Outcomes: Progressing   RN Treatment Plan for Primary Diagnosis: Schizoaffective disorder, bipolar type (HCC) Long Term Goal(s): Knowledge of disease and therapeutic regimen to maintain health will improve  Short Term Goals: Ability to demonstrate self-control, Ability to identify and develop effective coping behaviors will improve and Compliance with prescribed medications will improve  Medication Management: RN will administer medications as ordered by provider, will assess and evaluate patient's response and provide education to patient for prescribed medication. RN will report any adverse and/or side effects to prescribing provider.  Therapeutic Interventions: 1 on 1 counseling sessions, Psychoeducation, Medication administration, Evaluate responses to treatment, Monitor vital signs and CBGs as ordered, Perform/monitor CIWA, COWS, AIMS and Fall Risk screenings as ordered, Perform wound care treatments as ordered.  Evaluation of Outcomes: Progressing   LCSW Treatment Plan for Primary Diagnosis: Schizoaffective disorder, bipolar type (HCC) Long Term Goal(s): Safe transition to appropriate next  level of care at discharge, Engage patient in therapeutic group addressing interpersonal concerns.  Short Term Goals: Engage patient in aftercare planning with referrals and resources, Facilitate patient progression through stages of change regarding substance use diagnoses and concerns and Increase skills for wellness and recovery  Therapeutic Interventions: Assess for all discharge needs, 1 to 1 time with Social worker, Explore available resources and support systems, Assess for adequacy in community support network, Educate family and significant other(s) on suicide prevention, Complete Psychosocial Assessment, Interpersonal group therapy.  Evaluation of Outcomes: Progressing   Progress in Treatment: Attending groups: No. Participating in groups: No. Taking medication as prescribed: Yes. Toleration medication: Yes. Family/Significant other contact made: No, will contact:  when given permission Patient understands diagnosis: Yes. Discussing patient identified problems/goals with staff: Yes. Medical problems stabilized or resolved: Yes. Denies suicidal/homicidal ideation: Yes. Issues/concerns per patient self-inventory: No. Other: none   New problem(s) identified: No, Describe:  none  New Short Term/Long Term Goal(s):Pt goal to obtain treatment for alcohol abuse.  Discharge Plan or Barriers: CSW assessing for appropriate plan.  Reason for Continuation of Hospitalization: Medication stabilization Withdrawal symptoms  Estimated Length of Stay: 2-4 days.  Attendees: Patient: Patrick [name] 09/14/2016   Physician: Dr. Jennet Maduro, MD 09/14/2016   Nursing: Hulan Amato, RN 09/14/2016   RN Care Manager: 09/14/2016   Social Worker: Daleen Squibb, LCSW 09/14/2016   Recreational Therapist: Hershal Coria, LRT/CTRS  09/14/2016   Other: Arther Abbott, BS Chaplain 09/14/2016   Other:  09/14/2016   Other: 09/14/2016     Scribe for Treatment Team: Lorri Frederick, LCSW 09/14/2016 1:09 PM

## 2016-09-14 NOTE — Discharge Summary (Signed)
Physician Discharge Summary Note  Patient:  Patrick [name] is an 31 y.o., male MRN:  [id number] DOB:  09/18/1985 Patient phone:  [phone] (home)  Patient address:   [address] [place] [id number],  Total Time spent with patient: 30 minutes  Date of Admission:  09/12/2016 Date of Discharge: 09/15/2016  Reason for Admission:  Suicidal ideation.  Identifying data. [name] is a 31 year old male with history of schizoaffective disorder and alcoholism.  Chief complaint. "I'm detoxing."  History of present illness. Information was obtained from the patient and the chart. The patient came to the ER complaining of depression and suicidalideation with a plan to overdose in the context of drinking. He has a long history of depression, anxiety, mood instability and substance use. He was recently hospitalized several time at [facility] here, here and in [place] for suicidal ideation in the context of drinking. He was prescribed Zyprexa at one facility and Seroquel and other. Apparently he has not been taking medications for the past week. He became increasingly depressed with poor sleep, decreased appetite, anhedonia, being of guilt and hopelessness worthlessness, poor energy and concentration, social isolation, crying spells, heightened anxiety and suicidal thinking with a plan to cut himself. This all happened in the context of heavy drinking a case of beer a day. The patient denies psychotic symptoms but reports severe anxiety. He has panic attacks several times a week, nightmares and flashbacks from his so that he suffered 2 years ago, and OCD type of symptoms with excessive worries and organizing. The patient also reports profound mood swings with periods of hyperactivity, insomnia, talkativeness, poor impulse control. In addition to alcohol his been using marijuana. He denies prescription pill use or other substance use at present but was positive for cannabis.  Past psychiatric history.  He suffered severe anxiety since childhood and was in therapy when young. He was diagnosed with schizoaffective disorder and has been treated with multiple medications but never any mood stabilizer like Depakote and lithium or Tegretol. He responded well to Zyprexa and Seroquel in the past. There were at least one suicide attempt by hanging. The patient used to cut, last time 2 years ago. 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 He was treated for substance abuse twice in residential setting but did not maintain sobriety for longer than a month. There is a history of alcohol withdrawal seizures. 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 He is no longer going there.  Family psychiatric history. Substance abuse on paternal side. He believes that there were family members with undiagnosed mental illness.  Social history. He graduated from high school and went to [employer] on for a year. He supports himself from a trust fund and does not work at present. He lives with his fianc and a 5949-month-old baby. His mother lives nearby.  Principal Problem: Schizoaffective disorder, bipolar type [facility](HCC) Discharge Diagnoses: Patient Active Problem List   Diagnosis Date Noted  . Schizoaffective disorder, bipolar type (HCC) [F25.0] 09/12/2016  . Noncompliance [Z91.19] 08/02/2016  . Suicidal ideation [R45.851] 08/02/2016  . Tobacco use disorder [F17.200] 08/02/2016  . Alcohol withdrawal (HCC) [F10.239] 08/02/2016  . PTSD (post-traumatic stress disorder) [F43.10] 08/02/2016  . Schizoaffective disorder, depressive type (HCC) [F25.1] 08/01/2016  . Cannabis use disorder, moderate, dependence (HCC) [F12.20] 08/01/2016  . Alcohol use disorder, moderate, dependence (HCC) [F10.20] 08/01/2016  . Alcohol  use disorder, severe, dependence (HCC) [F10.20] 12/30/2013  . Generalized anxiety  disorder [F41.1] 12/30/2012    Past Medical History:  Past Medical History:  Diagnosis Date  . Generalized anxiety disorder   . Major depressive disorder   . Schizoaffective disorder [facility](HCC)     Past Surgical History:  Procedure Laterality Date  . SPLENECTOMY     Family History: History reviewed. No pertinent family history.  Social History:  History  Alcohol Use  . Yes    Comment: Occ     History  Drug Use  . Types: Marijuana    Comment: long ago pain    Social History   Social History  . Marital status: Single    Spouse name: N/A  . Number of children: N/A  . Years of education: N/A   Social History Main Topics  . Smoking status: Current Every Day Smoker    Packs/day: 2.00    Years: 15.00    Types: Cigarettes  . Smokeless tobacco: Never Used  . Alcohol use Yes     Comment: Occ  . Drug use: Yes    Types: Marijuana     Comment: long ago pain  . Sexual activity: Yes   Other Topics Concern  . None   Social History Narrative  . None    Hospital Course:    [name] is a 31 year old male with history of depression, anxiety, mood instability and alcoholism admitted for suicidal ideation with a plan to overdose in the context of treatment noncompliance and drinking.  1. Suicidal ideation. Resolved. The patient is able to contract for safety. He is forward thinking and optimistic about the future.   2. Mood. He requested Seroquel for depression and mood stabilization.   3. Alcohol detox. He completed librium taper. Vital signs were stable.  4. Insomnia. Resolved with Seroquel.    5. Substance abuse treatment. The patient is not interested in pharmacological treatment of alcoholism or residential treatment. He will follow up with [facility].   6. Smoking. Nicotine patch is available.  7. Metabolic syndrome monitoring. Lipid panel, TSH and Hemoglobin A1c were normal during recent hospitalization.   8. EKG. Sinus tachycardia, QTc 428.  9.  Headache. Tylenol was available.  10. Disposition. He was discharged to home with family. He will follow up with [facility].  Physical Findings: AIMS: Facial and Oral Movements Muscles of Facial Expression: None, normal Lips and Perioral Area: None, normal Jaw: None, normal Tongue: None, normal,Extremity Movements Upper (arms, wrists, hands, fingers): None, normal Lower (legs, knees, ankles, toes): None, normal, Trunk Movements Neck, shoulders, hips: None, normal, Overall Severity Severity of abnormal movements (highest score from questions above): None, normal Incapacitation due to abnormal movements: None, normal Patient's awareness of abnormal movements (rate only patient's report): No Awareness, Dental Status Current problems with teeth and/or dentures?: Yes Does patient usually wear dentures?: No  CIWA:  CIWA-Ar Total: 0 COWS:     Musculoskeletal: Strength & Muscle Tone: within normal limits Gait & Station: normal Patient leans: N/A  Psychiatric Specialty Exam: Physical Exam  Nursing note and vitals reviewed. Psychiatric: He has a normal mood and affect. His speech is normal and behavior is normal. Thought content normal. Cognition and memory are normal. He expresses impulsivity.    Review of Systems  Psychiatric/Behavioral: Positive for substance abuse.  All other systems reviewed and are negative.   Blood pressure 112/80, pulse 98, temperature 99.1 F (37.3 C), resp. rate 16, height 5\' 10"  (1.778 m), weight 83.9 kg (  185 lb), SpO2 100 %.Body mass index is 26.54 kg/m.  General Appearance: Casual  Eye Contact:  Good  Speech:  Clear and Coherent  Volume:  Normal  Mood:  Euthymic  Affect:  Appropriate  Thought Process:  Goal Directed and Descriptions of Associations: Intact  Orientation:  Full (Time, Place, and Person)  Thought Content:  WDL  Suicidal Thoughts:  No  Homicidal Thoughts:  No  Memory:  Immediate;   Fair Recent;   Fair Remote;   Fair  Judgement:  Poor   Insight:  Lacking  Psychomotor Activity:  Normal  Concentration:  Concentration: Fair and Attention Span: Fair  Recall:  [employer] of Knowledge:  Fair  Language:  Fair  Akathisia:  No  Handed:  Right  AIMS (if indicated):     Assets:  Communication Skills Desire for Improvement Financial Resources/Insurance Housing Intimacy Physical Health Resilience Social Support Transportation  ADL's:  Intact  Cognition:  WNL  Sleep:  Number of Hours: 6        Has this patient used any form of tobacco in the last 30 days? (Cigarettes, Smokeless Tobacco, Cigars, and/or Pipes) Yes, Yes, A prescription for an FDA-approved tobacco cessation medication was offered at discharge and the patient refused  Blood Alcohol level:  Lab Results  Component Value Date   ETH 200 (H) 09/10/2016   ETH 94 (H) 07/30/2016    Metabolic Disorder Labs:  Lab Results  Component Value Date   HGBA1C 5.0 08/02/2016   MPG 97 08/02/2016   Lab Results  Component Value Date   PROLACTIN 21.0 (H) 08/02/2016   Lab Results  Component Value Date   CHOL 208 (H) 08/02/2016   TRIG 153 (H) 08/02/2016   HDL 63 08/02/2016   CHOLHDL 3.3 08/02/2016   VLDL 31 08/02/2016   LDLCALC 114 (H) 08/02/2016    See Psychiatric Specialty Exam and Suicide Risk Assessment completed by Attending Physician prior to discharge.  Discharge destination:  Home  Is patient on multiple antipsychotic therapies at discharge:  No   Has Patient had three or more failed trials of antipsychotic monotherapy by history:  No  Recommended Plan for Multiple Antipsychotic Therapies: NA  Discharge Instructions    Diet - low sodium heart healthy    Complete by:  As directed    Increase activity slowly    Complete by:  As directed      Allergies as of 09/14/2016      Reactions   Sulfa Antibiotics Anaphylaxis   Antihistamines, Chlorpheniramine-type Hives   Diphenhydramine Hcl Palpitations      Medication List    STOP taking these  medications   buprenorphine-naloxone 8-2 mg Subl SL tablet Commonly known as:  SUBOXONE     TAKE these medications     Indication  QUEtiapine 100 MG tablet Commonly known as:  SEROQUEL Take 1 tablet (100 mg total) by mouth 3 (three) times daily.  Indication:  Depressive Phase of Manic-Depression   QUEtiapine 200 MG tablet Commonly known as:  SEROQUEL Take 1 tablet (200 mg total) by mouth at bedtime.  Indication:  Depressive Phase of Manic-Depression        Follow-up recommendations:  Activity:  As tolerated. Diet:  Low sodium heart healthy. Other:  Keep follow-up appointments.  Comments:    Signed: Kristine Linea, MD 09/14/2016, 11:37 AM

## 2016-09-14 NOTE — Progress Notes (Signed)
   09/14/16 1100  Clinical Encounter Type  Visited With Patient;Health care provider  Visit Type Other (Comment) (Treatment team )  Referral From Care management  Consult/Referral To Chaplain   Chaplain present during treatment team. Chaplain will follow up with patient after lunch.

## 2016-09-14 NOTE — Plan of Care (Signed)
Problem: Activity: Goal: Sleeping patterns will improve Outcome: Progressing Reports that he is sleeping well "I don't need any additional medication, I sleep well"

## 2016-09-14 NOTE — [facility] Counselor (Signed)
Adult Comprehensive Assessment  Patient ID: Patrick [name], male   DOB: [birth year], 31 y.o.   MRN: [id number]  Information Source: Information source: Patient  Current Stressors:  Substance abuse: I'm an alcoholic  Living/Environment/Situation:  Living Arrangements: Spouse/significant other, Children (57 child: 1 month old) Living conditions (as described by patient or guardian): no problems How long has patient lived in current situation?: 3 months What is atmosphere in current home: Comfortable  Family History:  Marital status: Single What types of issues is patient dealing with in the relationship?: Things are going well. Are you sexually active?: Yes What is your sexual orientation?: heterosexual Does patient have children?: Yes How many children?: 1 How is patient's relationship with their children?: 46 month old daughter.  Childhood History:  By whom was/is the patient raised?: Mother Additional childhood history information: father died whn pt was 15, "he was an alcoholic, weekend dad";  Description of patient's relationship with caregiver when they were a child: good w mother, father deceased Patient's description of current relationship with people who raised him/her: mother:  "we're civil" How were you disciplined when you got in trouble as a child/adolescent?: no discipline.  I did what I wanted to. Does patient have siblings?: No Did patient suffer any verbal/emotional/physical/sexual abuse as a child?: No Did patient suffer from severe childhood neglect?: No Has patient ever been sexually abused/assaulted/raped as an adolescent or adult?: No Was the patient ever a victim of a crime or a disaster?: No Witnessed domestic violence?: No Has patient been effected by domestic violence as an adult?: No  Education:  Highest grade of school patient has completed: 2 years college Currently a Consulting civil engineer?: No Learning disability?: No  Employment/Work Situation:   Employment  situation: Unemployed (pt has income from a trust and does not need to work) What is the longest time patient has a held a job?: Pt has never had to work. Has patient ever been in the [employer]?: No Are There Guns or Other Weapons in Your Home?: No  Financial Resources:   Financial resources:  (receives trust fund income each month) Does patient have a Lawyer or guardian?:  (na)  Alcohol/Substance Abuse:   What has been your use of drugs/alcohol within the last 12 months?: alcohol: daily, 1 case beer, 2-3 years, denies drug use. If attempted suicide, did drugs/alcohol play a role in this?: Yes Has alcohol/substance abuse ever caused legal problems?: Yes  Social Support System:   Patient's Community Support System: Good Describe Community Support System: fiancee Type of faith/religion: [employer] How does patient's faith help to cope with current illness?: I rarely attend anymore.  Leisure/Recreation:   Leisure and Hobbies: "drink beer and watch hockey" "play Playstation"  Strengths/Needs:   What things does the patient do well?: PT has not been attempting to be sober currently. In what areas does patient struggle / problems for patient: sneaking around to drink, keeping it from my girlfriend, going to bars with drinking friends  Discharge Plan:   Does patient have access to transportation?: Yes Will patient be returning to same living situation after discharge?: Yes Currently receiving community mental health services: No If no, would patient like referral for services when discharged?: Yes (What [place] Does patient have financial barriers related to discharge medications?: Yes Patient description of barriers related to discharge medications: No health insurance  Summary/Recommendations:   Summary and Recommendations (to be completed by the evaluator): Pt is 31 year old male from [place].  Pt diagnosed with schizoaffective disorder  and alcohol use  disorder and admitted due to alcohol withdrawal, depression, and suicidal ideation.  Recommendations for pt include crisis stabilization, therapeutic milieu, attend and participate in groups, medication management, and development of comprehensive mental wellness and substance use recovery plan. Upon discharge, pt would [facility].  Lorri FrederickWierda, Delany Steury Jon. 09/14/2016

## 2016-09-14 NOTE — Plan of Care (Signed)
Problem: Safety: Goal: Ability to remain free from injury will improve Outcome: Progressing Patient' safety maintained   

## 2016-09-14 NOTE — Plan of Care (Signed)
Problem: Activity: Goal: Sleeping patterns will improve Outcome: Progressing Patient slept for Estimated Hours of 6; q15 minutes safety round maintained, no injury or falls during this shift.    

## 2016-09-14 NOTE — Progress Notes (Signed)
   09/14/16 1005  Clinical Encounter Type  Visited With Patient  Visit Type Initial;Psychological support;Spiritual support;Social support;Behavioral Health  Referral From Nurse   Chaplain responded to consult request. Patient was sleeping. Chaplain will return later.

## 2016-09-14 NOTE — Progress Notes (Signed)
[facility] MD Progress Note  09/14/2016 11:28 AM Patrick [name]  MRN:  [id number]  Subjective:   [name] met with the treatment team today. He reports feeling better physically and emotionally. He is no longer suicidal. He tolerates Seroquel well. He complains of diarrhea from Luvox. We will discontinue. He has not been participating in programming as of yet and remains rather irritable. There are no somatic complaints no symptoms of alcohol withdrawal. Vital signs are stable.  09/14/2016.  Per nursing: Continued from previous note: symptoms. Affect flat, mood is irritable. Compliant with meals and encouraged to attend group sessions. Patient will be monitored with q 15 minute safety checks.  Principal Problem: Schizoaffective disorder, bipolar type ([place]) Diagnosis:   Patient Active Problem List   Diagnosis Date Noted  . Schizoaffective disorder, bipolar type ([place]) [F25.0] 09/12/2016  . Noncompliance [Z91.19] 08/02/2016  . Suicidal ideation [R45.851] 08/02/2016  . Tobacco use disorder [F17.200] 08/02/2016  . Alcohol withdrawal ([place]) [F10.239] 08/02/2016  . PTSD (post-traumatic stress disorder) [F43.10] 08/02/2016  . Schizoaffective disorder, depressive type ([place]) [F25.1] 08/01/2016  . Cannabis use disorder, moderate, dependence ([place]) [F12.20] 08/01/2016  . Alcohol use disorder, moderate, dependence ([place]) [F10.20] 08/01/2016  . Alcohol use disorder, severe, dependence ([place]) [F10.20] 12/30/2013  . Generalized anxiety disorder [F41.1] 12/30/2012   Total Time spent with patient: 30 minutes  Past Psychiatric History: mood instability, substance abuse.  Past Medical History:  Past Medical History:  Diagnosis Date  . Generalized anxiety disorder   . Major depressive disorder   . Schizoaffective disorder [facility])     Past Surgical History:  Procedure Laterality Date  . SPLENECTOMY     Family History: History reviewed. No pertinent family history. Family Psychiatric  History: see  H&P. Social History:  History  Alcohol Use  . Yes    Comment: Occ     History  Drug Use  . Types: Marijuana    Comment: long ago pain    Social History   Social History  . Marital status: Single    Spouse name: N/A  . Number of children: N/A  . Years of education: N/A   Social History Main Topics  . Smoking status: Current Every Day Smoker    Packs/day: 2.00    Years: 15.00    Types: Cigarettes  . Smokeless tobacco: Never Used  . Alcohol use Yes     Comment: Occ  . Drug use: Yes    Types: Marijuana     Comment: long ago pain  . Sexual activity: Yes   Other Topics Concern  . None   Social History Narrative  . None   Additional Social History:    Pain Medications: Hx of Prescriptions: hx of History of alcohol / drug use?: Yes Longest period of sobriety (when/how long): 76mmonth Negative Consequences of Use: Financial, Legal, Personal relationships Withdrawal Symptoms: Agitation, DTs, Irritability, Nausea / Vomiting, Patient aware of relationship between substance abuse and physical/medical complications, Sweats, Tremors, Tachycardia                    Sleep: Fair  Appetite:  Fair  Current Medications: Current Facility-Administered Medications  Medication Dose Route Frequency Provider Last Rate Last Dose  . acetaminophen (TYLENOL) tablet 650 mg  650 mg Oral Q6H PRN ,  B, MD   650 mg at 09/14/16 0821  . alum & mag hydroxide-simeth (MAALOX/MYLANTA) 200-200-20 MG/5ML suspension 30 mL  30 mL Oral Q4H PRN ,  B, MD   30 mL  at 09/14/16 1040  . chlordiazePOXIDE (LIBRIUM) capsule 50 mg  50 mg Oral QID ,  B, MD   50 mg at 09/14/16 0818  . fluvoxaMINE (LUVOX) tablet 50 mg  50 mg Oral QHS ,  B, MD   50 mg at 09/13/16 2107  . magnesium hydroxide (MILK OF MAGNESIA) suspension 30 mL  30 mL Oral Daily PRN ,  B, MD      . nicotine (NICODERM CQ - dosed in mg/24 hours) patch 21 mg  21  mg Transdermal Daily ,  B, MD   21 mg at 09/13/16 0820  . QUEtiapine (SEROQUEL) tablet 100 mg  100 mg Oral TID ,  B, MD   100 mg at 09/14/16 0818  . QUEtiapine (SEROQUEL) tablet 200 mg  200 mg Oral QHS ,  B, MD   200 mg at 09/13/16 2107  . traZODone (DESYREL) tablet 100 mg  100 mg Oral QHS PRN ,  B, MD        Lab Results: No results found for this or any previous visit (from the past 48 hour(s)).  Blood Alcohol level:  Lab Results  Component Value Date   ETH 200 (H) 09/10/2016   ETH 94 (H) 97/98/9211    Metabolic Disorder Labs: Lab Results  Component Value Date   HGBA1C 5.0 08/02/2016   MPG 97 08/02/2016   Lab Results  Component Value Date   PROLACTIN 21.0 (H) 08/02/2016   Lab Results  Component Value Date   CHOL 208 (H) 08/02/2016   TRIG 153 (H) 08/02/2016   HDL 63 08/02/2016   CHOLHDL 3.3 08/02/2016   VLDL 31 08/02/2016   LDLCALC 114 (H) 08/02/2016    Physical Findings: AIMS: Facial and Oral Movements Muscles of Facial Expression: None, normal Lips and Perioral Area: None, normal Jaw: None, normal Tongue: None, normal,Extremity Movements Upper (arms, wrists, hands, fingers): None, normal Lower (legs, knees, ankles, toes): None, normal, Trunk Movements Neck, shoulders, hips: None, normal, Overall Severity Severity of abnormal movements (highest score from questions above): None, normal Incapacitation due to abnormal movements: None, normal Patient's awareness of abnormal movements (rate only patient's report): No Awareness, Dental Status Current problems with teeth and/or dentures?: Yes Does patient usually wear dentures?: No  CIWA:  CIWA-Ar Total: 0 COWS:     Musculoskeletal: Strength & Muscle Tone: within normal limits Gait & Station: normal Patient leans: N/A  Psychiatric Specialty Exam: Physical Exam  Nursing note and vitals reviewed. Psychiatric: His speech is normal. His affect is  blunt. He is slowed and withdrawn. Cognition and memory are normal. He expresses impulsivity. He exhibits a depressed mood. He expresses suicidal ideation. He expresses suicidal plans.    Review of Systems  Neurological: Positive for tremors and headaches.  Psychiatric/Behavioral: Positive for substance abuse. The patient is nervous/anxious.   All other systems reviewed and are negative.   Blood pressure 112/80, pulse 98, temperature 99.1 F (37.3 C), resp. rate 16, height 5' 10" (1.778 m), weight 83.9 kg (185 lb), SpO2 100 %.Body mass index is 26.54 kg/m.  General Appearance: Disheveled  Eye Contact:  Fair  Speech:  Clear and Coherent  Volume:  Normal  Mood:  Depressed  Affect:  Blunt  Thought Process:  Goal Directed and Descriptions of Associations: Intact  Orientation:  Full (Time, Place, and Person)  Thought Content:  WDL  Suicidal Thoughts:  No  Homicidal Thoughts:  No  Memory:  Immediate;   Fair Recent;   Fair Remote;   Fair  Judgement:  Poor  Insight:  Lacking  Psychomotor Activity:  Psychomotor Retardation  Concentration:  Concentration: Fair and Attention Span: Fair  Recall:  [employer] of Knowledge:  Fair  Language:  Fair  Akathisia:  No  Handed:  Right  AIMS (if indicated):     Assets:  Communication Skills Desire for Improvement Financial Resources/Insurance Housing Intimacy Physical Health Resilience Social Support Transportation Vocational/Educational  ADL's:  Intact  Cognition:  WNL  Sleep:  Number of Hours: 6     Treatment Plan Summary: Daily contact with patient to assess and evaluate symptoms and progress in treatment and Medication management   [name] is a 31 year old male with history of depression, anxiety, mood instability and alcoholism admitted for suicidal ideation with a plan to overdose in the context of treatment noncompliance and drinking.  1. Suicidal ideation. The patient is able to contract for safety in the hospital.     2. Mood. He requested Seroquel for depression and mood stabilization.   3. Alcohol detox. We started librium taper. Vital signs are stable.  4. Anxiety. Unable to tolerate Luvox.    5. Insomnia. Trazodone is available.    6. Substance abuse treatment. The patient is not interested in pharmacological treatment of alcoholism or residential treatment.  7. Smoking. Nicotine patch is available.  8. Metabolic syndrome monitoring. Lipid panel, TSH and Hemoglobin A1c were normal during recent hospitalization.    9. EKG. Sinus tachycardia, QTc 428.  10. Headache. Tylenol is available.  11. Disposition. He will be discharged to home with family. He will follow up with [facility].  Orson Slick, MD 09/14/2016, 11:28 AM

## 2016-09-14 NOTE — Progress Notes (Signed)
D: Patient stated slept fair last night .Stated appetite is fair and energy level  Is normal. Stated concentration is good . Stated on Depression scale 0 , hopeless 0 and anxiety 0.( low 0-10 high) Denies suicidal  homicidal ideations  .  No auditory hallucinations  No pain concerns . Appropriate ADL'S. Interacting with peers and staff. Voice of having nausea  Today  Stayed close to bed  Voice of discharge tomorrow . Stated he is going to have to change . Writer review Diagnosis , system review of the body and the  Results of Alcoholism  A: Encourage patient participation with unit programming . Instruction  Given on  Medication , verbalize understanding. R: Voice no other concerns. Staff continue to monitor

## 2016-09-14 NOTE — Progress Notes (Signed)
Recreation Therapy Notes  Date: 06.27.18 Time: 1:00 pm Location: Craft Room  Group Topic: Self-esteem  Goal Area(s) Addresses:  Patient will be able to identify benefit of self-esteem. Patient will be able to identify ways to increase self-esteem.  Behavioral Response: Did not attend  Intervention: Self-Portrait  Activity: Patients were given a blank face worksheet and were instructed to draw a self-portrait of how they were feeling. Patients were given construction paper and were instructed to put their first name on it and one positive trait about themselves. Patients passed the papers around the room and wrote positive traits about peers. Patients were given blank face worksheets again to draw their self-portraits of how they felt after reading positive comments from peers.  Education: LRT educated patient on ways to increase their self-esteem.  Education Outcome: Patient did not attend group.   Clinical Observations/Feedback: Patient did not attend group.  Jacquelynn CreeGreene,Pierra Skora M, LRT/CTRS 09/14/2016 1:40 PM

## 2016-09-14 NOTE — Plan of Care (Signed)
Problem: Education: Goal: Emotional status will improve Outcome: Progressing Pleasant and cooperative, able to express his feelings appropriately

## 2016-09-14 NOTE — [facility] Group Notes (Signed)
[facility] LCSW Group Therapy  09/14/2016 10:43 AM  Type of Therapy:  Group Therapy  Participation Level:  Patient did not attend group. CSW invited patient to group.   Summary of Progress/Problems: Emotional Regulation: Patients will identify both negative and positive emotions. They will discuss emotions they have difficulty regulating and how they impact their lives. Patients will be asked to identify healthy coping skills to combat unhealthy reactions to negative emotions.   Patrick [name] G. Garnette CzechSampson MSW, LCSWA 09/14/2016, 10:43 AM

## 2016-09-14 NOTE — Plan of Care (Signed)
Problem: Safety: Goal: Ability to remain free from injury will improve Outcome: Progressing Patient able to alert staff for any discomfort .

## 2016-09-14 NOTE — Progress Notes (Signed)
Patient stayed in the milieu and interacted with staff and peers appropriately. Alert and oriented and denying thoughts of self harm. Denying hallucinations. Presented to the medications room and reported that he is sleeping well and refused sleeping medications. Asking about discharge and expressing readiness. Was encouraged to discuss with MD. Therapeutic milieu promoted. Patient's safety maintained on the unit.

## 2016-09-15 NOTE — Progress Notes (Signed)
  [facility] :  Will you be returning to the same living situation after discharge:  Yes,  with fiancee At discharge, do you have transportation home?: Yes,  fiancee Do you have the ability to pay for your medications: No. Medication management referral made.  Release of information consent forms completed and in the chart;  Patient's signature needed at discharge.  Patient to Follow up at: Follow-up Information    [facility] Go on 09/19/2016.   Why:  [facility], at 2:30pm.  Please bring a copy of your hospital discharge paperwork. Contact information: [address] [place] [id number] [phone]           Next level [facility] care provider [facility]no  Safety Planning and Suicide Prevention discussed: No.Pt refused family contact.  Individual SPE completed.     Has patient been referred to the Quitline?: Patient refused referral  Patient has been referred for addiction treatment: Yes  Lorri FrederickWierda, Mckayla Mulcahey Jon, LCSW 09/15/2016, 10:03 AM

## 2016-09-15 NOTE — Progress Notes (Signed)
Pt calm and cooperative.  Med compliant.  No behavioral issues.  Discharge-focused.   Discharge orders received.  Discharge instructions including follow up appts, prescriptions and suicide helpline reviewed with patient.  Pt verbalized understanding.  All belongings returned to patient.  Escorted off unit, no acute distress.

## 2016-09-15 NOTE — [facility] Suicide Risk Assessment (Signed)
[facility] INPATIENT:  Family/Significant Other Suicide Prevention Education  Suicide Prevention Education:  Patient Refusal for Family/Significant Other Suicide Prevention Education: The patient Patrick [name] has refused to provide written consent for family/significant other to be provided Family/Significant Other Suicide Prevention Education during admission and/or prior to discharge.  Physician notified.  Lorri FrederickWierda, Joscelyn Hardrick Jon, LCSW 09/15/2016, 8:37 AM

## 2016-09-15 NOTE — [facility] Group Notes (Signed)
[facility] LCSW Group Therapy Note  Type of Therapy and Topic:  Group Therapy:  Goals Group: SMART Goals  Participation Level:  Patient did not attend group. CSW invited patient to group.   Description of Group:   The purpose of a daily goals group is to assist and guide patients in setting recovery/wellness-related goals.  The objective is to set goals as they relate to the crisis in which they were admitted. Patients will be using SMART goal modalities to set measurable goals.  Characteristics of realistic goals will be discussed and patients will be assisted in setting and processing how one will reach their goal. Facilitator will also assist patients in applying interventions and coping skills learned in psycho-education groups to the SMART goal and process how one will achieve defined goal.  Therapeutic Goals: -Patients will develop and document one goal related to or their crisis in which brought them into treatment. -Patients will be guided by LCSW using SMART goal setting modality in how to set a measurable, attainable, realistic and time sensitive goal.  -Patients will process barriers in reaching goal. -Patients will process interventions in how to overcome and successful in reaching goal.   Summary of Patient Progress:  Patient Goal: None identified at this time, patient did not attend group.   Therapeutic Modalities:   Motivational Interviewing Engineer, manufacturing systemsCognitive Behavioral Therapy Crisis Intervention Model SMART goals setting  Jeniyah Menor G. Garnette CzechSampson MSW, [facility] CorporationCSWA 09/15/2016 10:26 AM

## 2016-09-15 NOTE — Discharge Summary (Signed)
Physician Discharge Summary Note  Patient:  Patrick [name] is an 31 y.o., male MRN:  [id number] DOB:  09/18/1985 Patient phone:  [phone] (home)  Patient address:   [address] [place] [address],  Total Time spent with patient: 30 minutes  Date of Admission:  09/12/2016 Date of Discharge: 09/15/2016  Reason for Admission:  Suicidal ideation.  Identifying data. Patrick [name] is a 31 year old male with history of schizoaffective disorder and alcoholism.  Chief complaint. "I'm detoxing."  History of present illness. Information was obtained from the patient and the chart. The patient came to the ER complaining of depression and suicidalideation with a plan to overdose in the context of drinking. He has a long history of depression, anxiety, mood instability and substance use. He was recently hospitalized several time at [facility] here, here and in [place] for suicidal ideation in the context of drinking. He was prescribed Zyprexa at one facility and Seroquel and other. Apparently he has not been taking medications for the past week. He became increasingly depressed with poor sleep, decreased appetite, anhedonia, being of guilt and hopelessness worthlessness, poor energy and concentration, social isolation, crying spells, heightened anxiety and suicidal thinking with a plan to cut himself. This all happened in the context of heavy drinking a case of beer a day. The patient denies psychotic symptoms but reports severe anxiety. He has panic attacks several times a week, nightmares and flashbacks from his so that he suffered 2 years ago, and OCD type of symptoms with excessive worries and organizing. The patient also reports profound mood swings with periods of hyperactivity, insomnia, talkativeness, poor impulse control. In addition to alcohol his been using marijuana. He denies prescription pill use or other substance use at present but was positive for cannabis.  Past psychiatric history.  He suffered severe anxiety since childhood and was in therapy when young. He was diagnosed with schizoaffective disorder and has been treated with multiple medications but never any mood stabilizer like Depakote and lithium or Tegretol. He responded well to Zyprexa and Seroquel in the past. There were at least one suicide attempt by hanging. The patient used to cut, last time 2 years ago. 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 He was treated for substance abuse twice in residential setting but did not maintain sobriety for longer than a month. There is a history of alcohol withdrawal seizures. 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 He is no longer going there.  Family psychiatric history. Substance abuse on paternal side. He believes that there were family members with undiagnosed mental illness.  Social history. He graduated from high school and went to [employer] on for a year. He supports himself from a trust fund and does not work at present. He lives with his fianc and a 5949-month-old baby. His mother lives nearby.  Principal Problem: Schizoaffective disorder, bipolar type [facility](HCC) Discharge Diagnoses: Patient Active Problem List   Diagnosis Date Noted  . Schizoaffective disorder, bipolar type (HCC) [F25.0] 09/12/2016  . Noncompliance [Z91.19] 08/02/2016  . Suicidal ideation [R45.851] 08/02/2016  . Tobacco use disorder [F17.200] 08/02/2016  . Alcohol withdrawal (HCC) [F10.239] 08/02/2016  . PTSD (post-traumatic stress disorder) [F43.10] 08/02/2016  . Schizoaffective disorder, depressive type (HCC) [F25.1] 08/01/2016  . Cannabis use disorder, moderate, dependence (HCC) [F12.20] 08/01/2016  . Alcohol use disorder, moderate, dependence (HCC) [F10.20] 08/01/2016  . Alcohol  use disorder, severe, dependence (HCC) [F10.20] 12/30/2013  . Generalized anxiety  disorder [F41.1] 12/30/2012    Past Medical History:  Past Medical History:  Diagnosis Date  . Generalized anxiety disorder   . Major depressive disorder   . Schizoaffective disorder [facility])     Past Surgical History:  Procedure Laterality Date  . SPLENECTOMY     Family History: History reviewed. No pertinent family history.  Social History:  History  Alcohol Use  . Yes    Comment: Occ     History  Drug Use  . Types: Marijuana    Comment: long ago pain    Social History   Social History  . Marital status: Single    Spouse name: N/A  . Number of children: N/A  . Years of education: N/A   Social History Main Topics  . Smoking status: Current Every Day Smoker    Packs/day: 2.00    Years: 15.00    Types: Cigarettes  . Smokeless tobacco: Never Used  . Alcohol use Yes     Comment: Occ  . Drug use: Yes    Types: Marijuana     Comment: long ago pain  . Sexual activity: Yes   Other Topics Concern  . None   Social History Narrative  . None    Hospital Course:    [name] is a 31 year old male with history of depression, anxiety, mood instability and alcoholism admitted for suicidal ideation with a plan to overdose in the context of treatment noncompliance and drinking.  1. Suicidal ideation. Resolved. The patient is able to contract for safety. He is forward thinking and optimistic about the future.   2. Mood. He requested Seroquel for depression and mood stabilization.   3. Alcohol detox. He completed librium taper. Vital signs were stable.  4. Insomnia. Resolved with Seroquel.    5. Substance abuse treatment. The patient is not interested in pharmacological treatment of alcoholism or residential treatment. He will follow up with [facility].   6. Smoking. Nicotine patch is available.  7. Metabolic syndrome monitoring. Lipid panel, TSH and Hemoglobin A1c were normal during recent hospitalization.   8. EKG. Sinus tachycardia, QTc 428.  9.  Headache. Tylenol was available.  10. Disposition. He was discharged to home with family. He will follow up with [facility].  Physical Findings: AIMS: Facial and Oral Movements Muscles of Facial Expression: None, normal Lips and Perioral Area: None, normal Jaw: None, normal Tongue: None, normal,Extremity Movements Upper (arms, wrists, hands, fingers): None, normal Lower (legs, knees, ankles, toes): None, normal, Trunk Movements Neck, shoulders, hips: None, normal, Overall Severity Severity of abnormal movements (highest score from questions above): None, normal Incapacitation due to abnormal movements: None, normal Patient's awareness of abnormal movements (rate only patient's report): No Awareness, Dental Status Current problems with teeth and/or dentures?: Yes Does patient usually wear dentures?: No  CIWA:  CIWA-Ar Total: 0 COWS:     Musculoskeletal: Strength & Muscle Tone: within normal limits Gait & Station: normal Patient leans: N/A  Psychiatric Specialty Exam: Physical Exam  Nursing note and vitals reviewed. Psychiatric: He has a normal mood and affect. His speech is normal and behavior is normal. Thought content normal. Cognition and memory are normal. He expresses impulsivity.    Review of Systems  Psychiatric/Behavioral: Positive for substance abuse.  All other systems reviewed and are negative.   Blood pressure 123/71, pulse 88, temperature 97.8 F (36.6 C), resp. rate 16, height 5\' 10"  (1.778 m), weight 83.9 kg (  185 lb), SpO2 100 %.Body mass index is 26.54 kg/m.  General Appearance: Casual  Eye Contact:  Good  Speech:  Clear and Coherent  Volume:  Normal  Mood:  Euthymic  Affect:  Appropriate  Thought Process:  Goal Directed and Descriptions of Associations: Intact  Orientation:  Full (Time, Place, and Person)  Thought Content:  WDL  Suicidal Thoughts:  No  Homicidal Thoughts:  No  Memory:  Immediate;   Fair Recent;   Fair Remote;   Fair  Judgement:  Poor   Insight:  Lacking  Psychomotor Activity:  Normal  Concentration:  Concentration: Fair and Attention Span: Fair  Recall:  [employer] of Knowledge:  Fair  Language:  Fair  Akathisia:  No  Handed:  Right  AIMS (if indicated):     Assets:  Communication Skills Desire for Improvement Financial Resources/Insurance Housing Intimacy Physical Health Resilience Social Support Transportation  ADL's:  Intact  Cognition:  WNL  Sleep:  Number of Hours: 7        Has this patient used any form of tobacco in the last 30 days? (Cigarettes, Smokeless Tobacco, Cigars, and/or Pipes) Yes, Yes, A prescription for an FDA-approved tobacco cessation medication was offered at discharge and the patient refused  Blood Alcohol level:  Lab Results  Component Value Date   ETH 200 (H) 09/10/2016   ETH 94 (H) 07/30/2016    Metabolic Disorder Labs:  Lab Results  Component Value Date   HGBA1C 5.0 08/02/2016   MPG 97 08/02/2016   Lab Results  Component Value Date   PROLACTIN 21.0 (H) 08/02/2016   Lab Results  Component Value Date   CHOL 208 (H) 08/02/2016   TRIG 153 (H) 08/02/2016   HDL 63 08/02/2016   CHOLHDL 3.3 08/02/2016   VLDL 31 08/02/2016   LDLCALC 114 (H) 08/02/2016    See Psychiatric Specialty Exam and Suicide Risk Assessment completed by Attending Physician prior to discharge.  Discharge destination:  Home  Is patient on multiple antipsychotic therapies at discharge:  No   Has Patient had three or more failed trials of antipsychotic monotherapy by history:  No  Recommended Plan for Multiple Antipsychotic Therapies: NA  Discharge Instructions    Diet - low sodium heart healthy    Complete by:  As directed    Increase activity slowly    Complete by:  As directed      Allergies as of 09/15/2016      Reactions   Sulfa Antibiotics Anaphylaxis   Antihistamines, Chlorpheniramine-type Hives   Diphenhydramine Hcl Palpitations      Medication List    STOP taking these  medications   buprenorphine-naloxone 8-2 mg Subl SL tablet Commonly known as:  SUBOXONE     TAKE these medications     Indication  QUEtiapine 100 MG tablet Commonly known as:  SEROQUEL Take 1 tablet (100 mg total) by mouth 3 (three) times daily.  Indication:  Depressive Phase of Manic-Depression   QUEtiapine 200 MG tablet Commonly known as:  SEROQUEL Take 1 tablet (200 mg total) by mouth at bedtime.  Indication:  Depressive Phase of Manic-Depression      Follow-up Information    [facility] Go on 09/16/2016.   Why:  Please attend your hospital discharge appointment at [facility] on Friday, 09/16/16, at 2:30pm.  Please bring a copy of your hospital discharge paperwork. Contact information: [address] [place] [address] [phone]  Follow-up recommendations:  Activity:  As tolerated. Diet:  Low sodium heart healthy. Other:  Keep follow-up appointments.  Comments:    Signed: Kristine Linea, MD 09/15/2016, 9:11 AM

## 2016-09-15 NOTE — [facility] Group Notes (Signed)
[facility] LCSW Group Therapy  09/15/2016 10:26 AM  Type of Therapy:  Group Therapy  Participation Level:  Patient did not attend group. CSW invited patient to group.   Summary of Progress/Problems: Balance in life: Patients will discuss the concept of balance and how it looks and feels to be unbalanced. Pt will identify areas in their life that is unbalanced and ways to become more balanced. They discussed what aspects in their lives has influenced their self care. Patients also discussed self care in the areas of self regulation/control, hygiene/appearance, sleep/relaxation, healthy leisure, healthy eating habits, exercise, inner peace/spirituality, self improvement, sobriety, and health management. They were challenged to identify changes that are needed in order to improve self care.  Patrick [name] MSW, LCSWA 09/15/2016, 10:27 AM

## 2016-09-15 NOTE — [facility] Suicide Risk Assessment (Signed)
[facility]   Principal Problem: Schizoaffective disorder, bipolar type [facility](HCC) Discharge Diagnoses:  Patient Active Problem List   Diagnosis Date Noted  . Schizoaffective disorder, bipolar type (HCC) [F25.0] 09/12/2016  . Noncompliance [Z91.19] 08/02/2016  . Suicidal ideation [R45.851] 08/02/2016  . Tobacco use disorder [F17.200] 08/02/2016  . Alcohol withdrawal (HCC) [F10.239] 08/02/2016  . PTSD (post-traumatic stress disorder) [F43.10] 08/02/2016  . Schizoaffective disorder, depressive type (HCC) [F25.1] 08/01/2016  . Cannabis use disorder, moderate, dependence (HCC) [F12.20] 08/01/2016  . Alcohol use disorder, moderate, dependence (HCC) [F10.20] 08/01/2016  . Alcohol use disorder, severe, dependence (HCC) [F10.20] 12/30/2013  . Generalized anxiety disorder [F41.1] 12/30/2012    Total Time spent with patient: 30 minutes  Musculoskeletal: Strength & Muscle Tone: within normal limits Gait & Station: normal Patient leans: N/A  Psychiatric Specialty Exam: Review of Systems  Psychiatric/Behavioral: Positive for substance abuse.  All other systems reviewed and are negative.   Blood pressure 123/71, pulse 88, temperature 97.8 F (36.6 C), resp. rate 16, height 5\' 10"  (1.778 m), weight 83.9 kg (185 lb), SpO2 100 %.Body mass index is 26.54 kg/m.  General Appearance: Disheveled  Eye Contact::  Good  Speech:  Clear and Coherent409  Volume:  Normal  Mood:  Euthymic  Affect:  Appropriate  Thought Process:  Goal Directed and Descriptions of Associations: Intact  Orientation:  Full (Time, Place, and Person)  Thought Content:  WDL  Suicidal Thoughts:  No  Homicidal Thoughts:  No  Memory:  Immediate;   Fair Recent;   Fair Remote;   Fair  Judgement:  Poor  Insight:  Lacking  Psychomotor Activity:  Normal  Concentration:  Fair  Recall:  [employer]  Fund of Knowledge:Fair  Language: Fair  Akathisia:  No  Handed:  Right  AIMS (if indicated):     Assets:   Communication Skills Desire for Improvement Financial Resources/Insurance Housing Intimacy Physical Health Resilience Social Support Transportation  Sleep:  Number of Hours: 7  Cognition: WNL  ADL's:  Intact   Mental Status Per Nursing Assessment::   On Admission:     Demographic Factors:  Male, Caucasian and Unemployed  Loss Factors: Legal issues  Historical Factors: Prior suicide attempts, Family history of mental illness or substance abuse and Impulsivity  Risk Reduction Factors:   Responsible for children under 31 years of age, Sense of responsibility to family, Living with another person, especially a relative and Positive social support  Continued Clinical Symptoms:  Bipolar Disorder:   Depressive phase Alcohol/Substance Abuse/Dependencies  Cognitive Features That Contribute To Risk:  None    Suicide Risk:  Minimal: No identifiable suicidal ideation.  Patients presenting with no risk factors but with morbid ruminations; may be classified as minimal risk based on the severity of the depressive symptoms  Follow-up Information    [facility] Go on 09/16/2016.   Why:  Please [facility] on Friday, 09/16/16, at 2:30pm.  Please bring a copy of your hospital discharge paperwork. Contact information: [address] [place] [address] [phone]           Plan Of Care/Follow-up recommendations:  Activity:  As tolerated. Diet:  Low sodium heart healthy. Other:  Keep follow-up appointments.  Kristine LineaJolanta Pucilowska, MD 09/15/2016, 9:11 AM

## 2017-02-01 ENCOUNTER — Telehealth: Payer: Self-pay | Admitting: Pharmacy Technician

## 2017-02-01 NOTE — Telephone Encounter (Signed)
[facility] filled initial prescription.  Patient was given new patient packet.  Patient never returned information or scheduled and eligibility appointment.  [facility] unable to provide additional medication assistance until eligibility is determined.  Eddye Broxterman J. Sicilia Killough Care Manager [facility] 

## 2017-04-05 ENCOUNTER — Emergency Department
Admission: EM | Admit: 2017-04-05 | Discharge: 2017-04-06 | Disposition: A | Discharge status: Other Acute Inpatient Hospital | Payer: Self-pay | Attending: Student in an Organized Health Care Education/Training Program | Admitting: Student in an Organized Health Care Education/Training Program

## 2017-04-05 ENCOUNTER — Encounter: Payer: Self-pay | Admitting: Emergency Medicine

## 2017-04-05 DIAGNOSIS — F1029 Alcohol dependence with unspecified alcohol-induced disorder: Secondary | ICD-10-CM | POA: Insufficient documentation

## 2017-04-05 DIAGNOSIS — F251 Schizoaffective disorder, depressive type: Secondary | ICD-10-CM | POA: Insufficient documentation

## 2017-04-05 DIAGNOSIS — Z79899 Other long term (current) drug therapy: Secondary | ICD-10-CM | POA: Insufficient documentation

## 2017-04-05 DIAGNOSIS — R45851 Suicidal ideations: Secondary | ICD-10-CM | POA: Insufficient documentation

## 2017-04-05 DIAGNOSIS — F10229 Alcohol dependence with intoxication, unspecified: Secondary | ICD-10-CM | POA: Insufficient documentation

## 2017-04-05 DIAGNOSIS — F17210 Nicotine dependence, cigarettes, uncomplicated: Secondary | ICD-10-CM | POA: Insufficient documentation

## 2017-04-05 LAB — CBC
HCT: 43.6 % (ref 40.0–52.0)
HEMOGLOBIN: 14.6 g/dL (ref 13.0–18.0)
MCH: 28.4 pg (ref 26.0–34.0)
MCHC: 33.5 g/dL (ref 32.0–36.0)
MCV: 84.8 fL (ref 80.0–100.0)
Platelets: 456 10*3/uL — ABNORMAL HIGH (ref 150–440)
RBC: 5.15 MIL/uL (ref 4.40–5.90)
RDW: 14.3 % (ref 11.5–14.5)
WBC: 10.1 10*3/uL (ref 3.8–10.6)

## 2017-04-05 LAB — URINE DRUG SCREEN, QUALITATIVE (ARMC ONLY)
AMPHETAMINES, UR SCREEN: NOT DETECTED
Barbiturates, Ur Screen: NOT DETECTED
Benzodiazepine, Ur Scrn: NOT DETECTED
COCAINE METABOLITE, UR ~~LOC~~: NOT DETECTED
Cannabinoid 50 Ng, Ur ~~LOC~~: NOT DETECTED
MDMA (ECSTASY) UR SCREEN: NOT DETECTED
Methadone Scn, Ur: NOT DETECTED
Opiate, Ur Screen: NOT DETECTED
PHENCYCLIDINE (PCP) UR S: NOT DETECTED
Tricyclic, Ur Screen: NOT DETECTED

## 2017-04-05 LAB — COMPREHENSIVE METABOLIC PANEL
ALK PHOS: 73 U/L (ref 38–126)
ALT: 15 U/L — AB (ref 17–63)
ANION GAP: 12 (ref 5–15)
AST: 25 U/L (ref 15–41)
Albumin: 4.7 g/dL (ref 3.5–5.0)
BILIRUBIN TOTAL: 0.4 mg/dL (ref 0.3–1.2)
BUN: 16 mg/dL (ref 6–20)
CALCIUM: 9.5 mg/dL (ref 8.9–10.3)
CO2: 24 mmol/L (ref 22–32)
CREATININE: 0.74 mg/dL (ref 0.61–1.24)
Chloride: 100 mmol/L — ABNORMAL LOW (ref 101–111)
GFR calc non Af Amer: >60 mL/min (ref >60)
GLUCOSE: 101 mg/dL — AB (ref 65–99)
Potassium: 3.3 mmol/L — ABNORMAL LOW (ref 3.5–5.1)
Sodium: 136 mmol/L (ref 135–145)
TOTAL PROTEIN: 7.9 g/dL (ref 6.5–8.1)

## 2017-04-05 LAB — ETHANOL: Alcohol, Ethyl (B): 17 mg/dL — ABNORMAL HIGH (ref <10)

## 2017-04-05 LAB — ACETAMINOPHEN LEVEL: Acetaminophen (Tylenol), Serum: <10 ug/mL — ABNORMAL LOW (ref 10–30)

## 2017-04-05 LAB — SALICYLATE LEVEL: Salicylate Lvl: <7 mg/dL (ref 2.8–30.0)

## 2017-04-05 MED ORDER — OLANZAPINE 2.5 MG PO TABS
2.5000 mg | ORAL_TABLET | Freq: Every day | ORAL | Status: DC
  Administered 2017-04-05: 2.5 mg via ORAL
  Filled 2017-04-05: qty 1

## 2017-04-05 MED ORDER — LORAZEPAM 2 MG/ML IJ SOLN: 0.0000 mg | Freq: Four times a day (QID) | INTRAMUSCULAR | Status: DC

## 2017-04-05 MED ORDER — FLUOXETINE HCL 20 MG PO CAPS
20.0000 mg | ORAL_CAPSULE | Freq: Every day | ORAL | Status: DC
  Administered 2017-04-05: 20 mg via ORAL
  Filled 2017-04-05: qty 1

## 2017-04-05 MED ORDER — VITAMIN B-1 100 MG PO TABS
100.0000 mg | ORAL_TABLET | Freq: Every day | ORAL | Status: DC
  Administered 2017-04-06: 100 mg via ORAL
  Filled 2017-04-05: qty 1

## 2017-04-05 MED ORDER — LORAZEPAM 2 MG PO TABS: 0.0000 mg | ORAL_TABLET | Freq: Two times a day (BID) | ORAL | Status: DC

## 2017-04-05 MED ORDER — LORAZEPAM 2 MG/ML IJ SOLN: 0.0000 mg | Freq: Two times a day (BID) | INTRAMUSCULAR | Status: DC

## 2017-04-05 MED ORDER — LORAZEPAM 2 MG PO TABS
0.0000 mg | ORAL_TABLET | Freq: Four times a day (QID) | ORAL | Status: DC
  Administered 2017-04-05: 1 mg via ORAL
  Administered 2017-04-06: 2 mg via ORAL
  Filled 2017-04-05 (×2): qty 1

## 2017-04-05 MED ORDER — THIAMINE HCL 100 MG/ML IJ SOLN: 100.0000 mg | Freq: Every day | INTRAMUSCULAR | Status: DC

## 2017-04-05 NOTE — ED Notes (Signed)
Pt. Finished SOC. 

## 2017-04-05 NOTE — ED Provider Notes (Signed)
[facility] Emergency Department Provider Note    First MD Initiated Contact with Patient 04/05/17 1906     (approximate)  I have reviewed the triage vital signs and the nursing notes.   HISTORY  Chief Complaint Suicidal and Alcohol Intoxication    HPI Patrick [name] is a 32 y.o. male history of generalized anxiety disorders excessive disorder and reportedly schizoaffective disorder previous admissions to the psychiatric hospital presents today with suicidal ideation worsening anxiety and depression now 3 weeks after he was really prison.  States that he is having trouble coping with life outside of prison.  States he does have a plan to jump into oncoming traffic and is also concerned because he has been drinking more alcohol than usual and feels that he is dependent on this and would like to speak with someone regarding detox.  Past Medical History:  Diagnosis Date  . Generalized anxiety disorder   . Major depressive disorder   . Schizoaffective disorder (HCC)    No family history on file. Past Surgical History:  Procedure Laterality Date  . SPLENECTOMY     Patient Active Problem List   Diagnosis Date Noted  . Schizoaffective disorder, bipolar type (HCC) 09/12/2016  . Noncompliance 08/02/2016  . Suicidal ideation 08/02/2016  . Tobacco use disorder 08/02/2016  . Alcohol withdrawal (HCC) 08/02/2016  . PTSD (post-traumatic stress disorder) 08/02/2016  . Schizoaffective disorder, depressive type (HCC) 08/01/2016  . Cannabis use disorder, moderate, dependence (HCC) 08/01/2016  . Alcohol use disorder, moderate, dependence (HCC) 08/01/2016  . Alcohol use disorder, severe, dependence (HCC) 12/30/2013  . Generalized anxiety disorder 12/30/2012      Prior to Admission medications   Medication Sig Start Date End Date Taking? Authorizing Provider  QUEtiapine (SEROQUEL) 100 MG tablet Take 1 tablet (100 mg total) by mouth 3 (three) times daily. 09/14/16    Pucilowska, Braulio Conte B, MD  QUEtiapine (SEROQUEL) 200 MG tablet Take 1 tablet (200 mg total) by mouth at bedtime. 09/14/16   Pucilowska, Ellin Goodie, MD    Allergies Sulfa antibiotics; Antihistamines, chlorpheniramine-type; and Diphenhydramine hcl    Social History Social History   Tobacco Use  . Smoking status: Current Every Day Smoker    Packs/day: 2.00    Years: 15.00    Pack years: 30.00    Types: Cigarettes  . Smokeless tobacco: Never Used  Substance Use Topics  . Alcohol use: Yes    Comment: Occ  . Drug use: Yes    Types: Marijuana    Comment: long ago pain    Review of Systems Patient denies headaches, rhinorrhea, blurry vision, numbness, shortness of breath, chest pain, edema, cough, abdominal pain, nausea, vomiting, diarrhea, dysuria, fevers, rashes or hallucinations unless otherwise stated above in HPI. ____________________________________________   PHYSICAL EXAM:  VITAL SIGNS: Vitals:   04/05/17 1827  BP: 123/72  Pulse: 87  Resp: 20  Temp: 98 F (36.7 C)  SpO2: 97%    Constitutional: Alert and oriented. Well appearing and in no acute distress. Eyes: Conjunctivae are normal.  Head: Atraumatic. Nose: No congestion/rhinnorhea. Mouth/Throat: Mucous membranes are moist.   Neck: No stridor. Painless ROM.  Cardiovascular: Normal rate, regular rhythm. Grossly normal heart sounds.  Good peripheral circulation. Respiratory: Normal respiratory effort.  No retractions. Lungs CTAB. Gastrointestinal: Soft and nontender. No distention. No abdominal bruits. No CVA tenderness. Genitourinary:  Musculoskeletal: No lower extremity tenderness nor edema.  No joint effusions. Neurologic:  Normal speech and language. No gross focal neurologic deficits are appreciated.  No facial droop Skin:  Skin is warm, dry and intact. No rash noted. Psychiatric: appears anxious in the hallway, no agitation,  ____________________________________________   LABS (all labs ordered are  listed, but only abnormal results are displayed)  Results for orders placed or performed during the hospital encounter of 04/05/17 (from the past 24 hour(s))  Ethanol     Status: Abnormal   Collection Time: 04/05/17  6:45 PM  Result Value Ref Range   Alcohol, Ethyl (B) 17 (H) <10 mg/dL  Salicylate level     Status: None   Collection Time: 04/05/17  6:45 PM  Result Value Ref Range   Salicylate Lvl <7.0 2.8 - 30.0 mg/dL  Acetaminophen level     Status: Abnormal   Collection Time: 04/05/17  6:45 PM  Result Value Ref Range   Acetaminophen (Tylenol), Serum <10 (L) 10 - 30 ug/mL  cbc     Status: Abnormal   Collection Time: 04/05/17  6:45 PM  Result Value Ref Range   WBC 10.1 3.8 - 10.6 K/uL   RBC 5.15 4.40 - 5.90 MIL/uL   Hemoglobin 14.6 13.0 - 18.0 g/dL   HCT 16.1 09.6 - 04.5 %   MCV 84.8 80.0 - 100.0 fL   MCH 28.4 26.0 - 34.0 pg   MCHC 33.5 32.0 - 36.0 g/dL   RDW 40.9 81.1 - 91.4 %   Platelets 456 (H) 150 - 440 K/uL  Comprehensive metabolic panel     Status: Abnormal   Collection Time: 04/05/17  6:45 PM  Result Value Ref Range   Sodium 136 135 - 145 mmol/L   Potassium 3.3 (L) 3.5 - 5.1 mmol/L   Chloride 100 (L) 101 - 111 mmol/L   CO2 24 22 - 32 mmol/L   Glucose, Bld 101 (H) 65 - 99 mg/dL   BUN 16 6 - 20 mg/dL   Creatinine, Ser 7.82 0.61 - 1.24 mg/dL   Calcium 9.5 8.9 - 95.6 mg/dL   Total Protein 7.9 6.5 - 8.1 g/dL   Albumin 4.7 3.5 - 5.0 g/dL   AST 25 15 - 41 U/L   ALT 15 (L) 17 - 63 U/L   Alkaline Phosphatase 73 38 - 126 U/L   Total Bilirubin 0.4 0.3 - 1.2 mg/dL   GFR calc non Af Amer >60 >60 mL/min   GFR calc Af Amer >60 >60 mL/min   Anion gap 12 5 - 15  Urine Drug Screen, Qualitative     Status: None   Collection Time: 04/05/17  7:20 PM  Result Value Ref Range   Tricyclic, Ur Screen NONE DETECTED NONE DETECTED   Amphetamines, Ur Screen NONE DETECTED NONE DETECTED   MDMA (Ecstasy)Ur Screen NONE DETECTED NONE DETECTED   Cocaine Metabolite,Ur [place] NONE DETECTED NONE  DETECTED   Opiate, Ur Screen NONE DETECTED NONE DETECTED   Phencyclidine (PCP) Ur S NONE DETECTED NONE DETECTED   Cannabinoid 50 Ng, Ur [place] NONE DETECTED NONE DETECTED   Barbiturates, Ur Screen NONE DETECTED NONE DETECTED   Benzodiazepine, Ur Scrn NONE DETECTED NONE DETECTED   Methadone Scn, Ur NONE DETECTED NONE DETECTED   ____________________________________________  ____________________________________________  RADIOLOGY   ____________________________________________   PROCEDURES  Procedure(s) performed:  Procedures    Critical Care performed: no ____________________________________________   INITIAL IMPRESSION / ASSESSMENT AND PLAN / ED COURSE  Pertinent labs & imaging results that were available during my care of the patient were reviewed by me and considered in my medical decision making (see chart for  details).  DDX: Psychosis, delirium, medication effect, noncompliance, polysubstance abuse, Si, Hi, depression   Patrick [name] is a 32 y.o. who presents to the ED with for evaluation of SI and etoh depndence.  Patient has psych history of substance abuse and psych hospitalizations.  Laboratory testing was ordered to evaluation for underlying electrolyte derangement or signs of underlying organic pathology to explain today's presentation.  Based on history and physical and laboratory evaluation, it appears that the patient's presentation is 2/2 underlying psychiatric disorder and will require further evaluation and management by inpatient psychiatry.  Patient was  made an IVC due to SI.  Disposition pending psychiatric evaluation.       ____________________________________________   FINAL CLINICAL IMPRESSION(S) / ED DIAGNOSES  Final diagnoses:  Suicidal ideations  Alcohol dependence with unspecified alcohol-induced disorder (HCC)      NEW MEDICATIONS STARTED DURING THIS VISIT:  New Prescriptions   No medications on file     Note:  This document was  prepared using Dragon voice recognition software and may include unintentional dictation errors.    Willy Eddyobinson, Samanthamarie Ezzell, MD 04/05/17 2209

## 2017-04-05 NOTE — ED Notes (Signed)
First Nurse note:  Patient expressed SI thoughts so this RN placed him in the triage holding area where he can be observed by triage Rn's.  The patient Care coordinator is staying with the patient until the patient can be triaged.

## 2017-04-05 NOTE — ED Triage Notes (Signed)
Pt comes into the ED via POV c/o suicidal ideation and alcohol dependence.  Patient was released form jail 3 weeks ago and is having a hard time adjusting.  Patient states he was planning on walking out in front of a bus on the busy street [place]. Patient states he drinks 6-8 large beers a day that have 8% alcohol in each of them.  Patient has been diagnosed with major depressive disorder and anxiety.

## 2017-04-05 NOTE — ED Notes (Signed)
Pt. Introduced to unit.  Pt. Advised of security cameras and 15 minute checks.  Pt. States "I have been here before".

## 2017-04-06 ENCOUNTER — Other Ambulatory Visit: Payer: Self-pay

## 2017-04-06 ENCOUNTER — Inpatient Hospital Stay
Admission: AD | Admit: 2017-04-06 | Discharge: 2017-04-11 | DRG: 885 | Disposition: A | Discharge status: Home / Self Care | Payer: No Typology Code available for payment source | Attending: Psychiatry | Admitting: Psychiatry

## 2017-04-06 DIAGNOSIS — F411 Generalized anxiety disorder: Secondary | ICD-10-CM | POA: Diagnosis present

## 2017-04-06 DIAGNOSIS — F331 Major depressive disorder, recurrent, moderate: Secondary | ICD-10-CM | POA: Diagnosis present

## 2017-04-06 DIAGNOSIS — F251 Schizoaffective disorder, depressive type: Secondary | ICD-10-CM | POA: Diagnosis present

## 2017-04-06 DIAGNOSIS — R45851 Suicidal ideations: Secondary | ICD-10-CM | POA: Diagnosis present

## 2017-04-06 DIAGNOSIS — Z79899 Other long term (current) drug therapy: Secondary | ICD-10-CM

## 2017-04-06 DIAGNOSIS — F10939 Alcohol use, unspecified with withdrawal, unspecified: Secondary | ICD-10-CM | POA: Diagnosis present

## 2017-04-06 DIAGNOSIS — Y900 Blood alcohol level of less than 20 mg/100 ml: Secondary | ICD-10-CM | POA: Diagnosis present

## 2017-04-06 DIAGNOSIS — F17210 Nicotine dependence, cigarettes, uncomplicated: Secondary | ICD-10-CM | POA: Diagnosis present

## 2017-04-06 DIAGNOSIS — W19XXXA Unspecified fall, initial encounter: Secondary | ICD-10-CM | POA: Diagnosis not present

## 2017-04-06 DIAGNOSIS — F1020 Alcohol dependence, uncomplicated: Secondary | ICD-10-CM | POA: Diagnosis present

## 2017-04-06 DIAGNOSIS — Z8669 Personal history of other diseases of the nervous system and sense organs: Secondary | ICD-10-CM

## 2017-04-06 DIAGNOSIS — Z882 Allergy status to sulfonamides status: Secondary | ICD-10-CM

## 2017-04-06 DIAGNOSIS — R110 Nausea: Secondary | ICD-10-CM | POA: Diagnosis present

## 2017-04-06 DIAGNOSIS — F410 Panic disorder [episodic paroxysmal anxiety] without agoraphobia: Secondary | ICD-10-CM | POA: Diagnosis present

## 2017-04-06 DIAGNOSIS — F10230 Alcohol dependence with withdrawal, uncomplicated: Secondary | ICD-10-CM | POA: Diagnosis present

## 2017-04-06 DIAGNOSIS — R4589 Other symptoms and signs involving emotional state: Secondary | ICD-10-CM | POA: Diagnosis present

## 2017-04-06 DIAGNOSIS — Y92238 Other place in hospital as the place of occurrence of the external cause: Secondary | ICD-10-CM | POA: Diagnosis not present

## 2017-04-06 DIAGNOSIS — Z888 Allergy status to other drugs, medicaments and biological substances status: Secondary | ICD-10-CM

## 2017-04-06 DIAGNOSIS — Z9119 Patient's noncompliance with other medical treatment and regimen: Secondary | ICD-10-CM

## 2017-04-06 DIAGNOSIS — F17200 Nicotine dependence, unspecified, uncomplicated: Secondary | ICD-10-CM | POA: Diagnosis present

## 2017-04-06 DIAGNOSIS — Z915 Personal history of self-harm: Secondary | ICD-10-CM

## 2017-04-06 DIAGNOSIS — F10239 Alcohol dependence with withdrawal, unspecified: Secondary | ICD-10-CM | POA: Diagnosis present

## 2017-04-06 MED ORDER — LORAZEPAM 1 MG PO TABS
1.0000 mg | ORAL_TABLET | Freq: Once | ORAL | Status: AC
  Administered 2017-04-06: 1 mg via ORAL

## 2017-04-06 MED ORDER — MAGNESIUM HYDROXIDE 400 MG/5ML PO SUSP: 30.0000 mL | Freq: Every day | ORAL | Status: DC | PRN

## 2017-04-06 MED ORDER — NICOTINE 21 MG/24HR TD PT24
21.0000 mg | MEDICATED_PATCH | Freq: Every day | TRANSDERMAL | Status: DC
  Administered 2017-04-07 – 2017-04-11 (×5): 21 mg via TRANSDERMAL
  Filled 2017-04-06 (×5): qty 1

## 2017-04-06 MED ORDER — LORAZEPAM 2 MG/ML IJ SOLN: 1.0000 mg | Freq: Four times a day (QID) | INTRAMUSCULAR | Status: DC | PRN

## 2017-04-06 MED ORDER — THIAMINE HCL 100 MG/ML IJ SOLN
100.0000 mg | Freq: Every day | INTRAMUSCULAR | Status: DC
  Administered 2017-04-08: 100 mg via INTRAVENOUS
  Filled 2017-04-06 (×6): qty 1

## 2017-04-06 MED ORDER — ADULT MULTIVITAMIN W/MINERALS CH
1.0000 | ORAL_TABLET | Freq: Every day | ORAL | Status: DC
  Administered 2017-04-06 – 2017-04-11 (×6): 1 via ORAL
  Filled 2017-04-06 (×6): qty 1

## 2017-04-06 MED ORDER — NICOTINE 21 MG/24HR TD PT24
21.0000 mg | MEDICATED_PATCH | Freq: Once | TRANSDERMAL | Status: DC
  Administered 2017-04-06: 21 mg via TRANSDERMAL
  Filled 2017-04-06: qty 1

## 2017-04-06 MED ORDER — LORAZEPAM 1 MG PO TABS
1.0000 mg | ORAL_TABLET | Freq: Four times a day (QID) | ORAL | Status: AC | PRN
  Administered 2017-04-06 – 2017-04-09 (×9): 1 mg via ORAL
  Filled 2017-04-06 (×9): qty 1

## 2017-04-06 MED ORDER — ALUM & MAG HYDROXIDE-SIMETH 200-200-20 MG/5ML PO SUSP: 30.0000 mL | ORAL | Status: DC | PRN

## 2017-04-06 MED ORDER — TRAZODONE HCL 100 MG PO TABS
100.0000 mg | ORAL_TABLET | Freq: Every evening | ORAL | Status: DC | PRN
Start: 2017-04-06 — End: 2017-04-11
  Administered 2017-04-06 – 2017-04-10 (×4): 100 mg via ORAL
  Filled 2017-04-06 (×3): qty 1

## 2017-04-06 MED ORDER — LORAZEPAM 1 MG PO TABS
ORAL_TABLET | ORAL | Status: AC
  Filled 2017-04-06: qty 1

## 2017-04-06 MED ORDER — FOLIC ACID 1 MG PO TABS
1.0000 mg | ORAL_TABLET | Freq: Every day | ORAL | Status: DC
  Administered 2017-04-06 – 2017-04-11 (×6): 1 mg via ORAL
  Filled 2017-04-06 (×6): qty 1

## 2017-04-06 MED ORDER — QUETIAPINE FUMARATE 200 MG PO TABS
200.0000 mg | ORAL_TABLET | Freq: Every day | ORAL | Status: DC
  Administered 2017-04-06: 200 mg via ORAL
  Filled 2017-04-06: qty 1

## 2017-04-06 MED ORDER — ACETAMINOPHEN 325 MG PO TABS
650.0000 mg | ORAL_TABLET | Freq: Four times a day (QID) | ORAL | Status: DC | PRN
  Administered 2017-04-07 – 2017-04-09 (×3): 650 mg via ORAL
  Filled 2017-04-06 (×3): qty 2

## 2017-04-06 MED ORDER — VITAMIN B-1 100 MG PO TABS
100.0000 mg | ORAL_TABLET | Freq: Every day | ORAL | Status: DC
  Administered 2017-04-07 – 2017-04-11 (×4): 100 mg via ORAL
  Filled 2017-04-06 (×5): qty 1

## 2017-04-06 NOTE — Progress Notes (Signed)
D: Patient denies SI/HI/AVH. Patient verbally contracts for safety. Patient is in bed and interaction is breif. Patient's primary complaint is withdrawal symptoms and asks "when am I allowed to have more ativan." Patient will be assessed per CIWA protocol.   A: Patient was assessed by this nurse. Patient was oriented to unit. Patient's safety was maintained on unit. Q x 15 minute observation checks were completed for safety. Patient care plan was reviewed. Patient was offered support and encouragement. Patient was encourage to attend groups, participate in unit activities and continue with plan of care.   R: Patient has no complaints of pain at this time. Patient is receptive to treatment and safety maintained on unit.

## 2017-04-06 NOTE — Progress Notes (Signed)
[facility]. D/C and readmit to unit explain to patient. Pt. Denies ah/vh/si/hi and pain prior to d/c. All belongings transported with patient to other unit. Pt dressed in hospital scurbs upon d/c.

## 2017-04-06 NOTE — Plan of Care (Signed)
New admission not progressing  Not Progressing Education: Knowledge of [facility] [facility] Education information/materials will improve 04/06/2017 1806 - Not Progressing by Crist InfanteFarrish, Maximiliano Cromartie A, RN Coping: Ability to cope will improve 04/06/2017 1806 - Not Progressing by Crist InfanteFarrish, Mariela Rex A, RN Self-Concept: Ability to disclose and discuss suicidal ideas will improve 04/06/2017 1806 - Not Progressing by Crist InfanteFarrish, Sandy Haye A, RN Physical Regulation: Complications related to the disease process, condition or treatment will be avoided or minimized 04/06/2017 1806 - Not Progressing by Crist InfanteFarrish, Ivan Lacher A, RN   [facility]ally understands  [facility]  [facility]

## 2017-04-06 NOTE — BH Assessment (Signed)
Assessment Note  Patrick [name] is an 32 y.o. male, with a history of depression and anxiety, presenting to the ED with  suicidal ideations and alcohol dependence.  Patient was released form jail 3 weeks ago and is having a hard time adjusting.  Patient suicidal ideations with thoughts of walking out in front of a bus on the busy street [place] his [place]. Patient states he drinks 6-8 large beers a day that have 8% alcohol in each of them.  .     Diagnosis: History of Schizoaffective Disorder  Past Medical History:  Past Medical History:  Diagnosis Date  . Generalized anxiety disorder   . Major depressive disorder   . Schizoaffective disorder [facility](HCC)     Past Surgical History:  Procedure Laterality Date  . SPLENECTOMY      Family History: No family history on file.  Social History:  reports that he has been smoking cigarettes.  He has a 30.00 pack-year smoking history. he has never used smokeless tobacco. He reports that he drinks alcohol. He reports that he uses drugs. Drug: Marijuana.  Additional Social History:  Alcohol / Drug Use Pain Medications: See PTA Prescriptions: See PTA Over the Counter: See PTA History of alcohol / drug use?: Yes  CIWA: CIWA-Ar BP: 123/72 Pulse Rate: 87 COWS: Clinical Opiate Withdrawal Scale (COWS) Resting Pulse Rate: Pulse Rate 81-100 Sweating: No report of chills or flushing Restlessness: Reports difficulty sitting still, but is able to do so Pupil Size: Pupils possibly larger than normal for room light Bone or Joint Aches: Not present Runny Nose or Tearing: Not present GI Upset: No GI symptoms Tremor: Slight tremor observable Yawning: No yawning Anxiety or Irritability: Patient obviously irritable/anxious Gooseflesh Skin: Skin is smooth COWS Total Score: 7  Allergies:  Allergies  Allergen Reactions  . Sulfa Antibiotics Anaphylaxis  . Antihistamines, Chlorpheniramine-Type Hives  . Diphenhydramine Hcl Palpitations    Home Medications:   (Not in a hospital admission)  OB/GYN Status:  No LMP for male patient.  General Assessment Data [facility] Assessment: In system Is this a Tele or Face-to-Face Assessment?: Face-to-Face Is this an Initial Assessment or a Re-assessment for this encounter?: Initial Assessment Marital status: Single Living Arrangements: Spouse/significant other, Children Can pt return to current living arrangement?: Yes Admission Status: Voluntary Is patient capable of signing voluntary admission?: Yes Referral Source: Self/Family/Friend Insurance type: None     Crisis Care Plan Living Arrangements: Spouse/significant other, Children Legal Guardian: Other:(self) Name of Psychiatrist: none reported Name of Therapist: none reported  Education Status Is patient currently in school?: No Current Grade: na Highest grade of school patient has completed: hs Name of school: na Contact person: na  Risk to self with the past 6 months Suicidal Ideation: Yes-Currently Present Has patient been a risk to self within the past 6 months prior to admission? : No Suicidal Intent: No Has patient had any suicidal intent within the past 6 months prior to admission? : No Is patient at risk for suicide?: Yes Suicidal Plan?: No Has patient had any suicidal plan within the past 6 months prior to admission? : No Access to Means: Yes Specify Access to Suicidal Means: access to traffic What has been your use of drugs/alcohol within the last 12 months?: etoh Previous Attempts/Gestures: Yes How many times?: 1 Triggers for Past Attempts: Unknown Intentional Self Injurious Behavior: None Family Suicide History: No Recent stressful life event(s): Loss (Comment), Job Loss, Financial Problems Persecutory voices/beliefs?: No Depression: Yes Depression  Symptoms: Feeling worthless/self pity, Loss of interest in usual pleasures Substance abuse history and/or treatment for substance abuse?:  Yes Suicide prevention information given to non-admitted patients: Not applicable  Risk to Others within the past 6 months Homicidal Ideation: No Does patient have any lifetime risk of violence toward others beyond the six months prior to admission? : No Thoughts of Harm to Others: No Current Homicidal Intent: No Current Homicidal Plan: No Access to Homicidal Means: No History of harm to others?: No Assessment of Violence: None Noted Does patient have access to weapons?: No Criminal Charges Pending?: No Does patient have a court date: No Is patient on probation?: No  Psychosis Hallucinations: None noted Delusions: None noted  Mental Status Report Appearance/Hygiene: In scrubs Eye Contact: Good Motor Activity: Freedom of movement Speech: Logical/coherent Level of Consciousness: Alert, Restless Mood: Depressed Affect: Appropriate to circumstance Anxiety Level: Minimal Thought Processes: Relevant Judgement: Partial Orientation: Person, Place, Time, Situation Obsessive Compulsive Thoughts/Behaviors: None  Cognitive Functioning Concentration: Normal Memory: Recent Intact, Remote Intact IQ: Average Insight: Fair Impulse Control: Fair Appetite: Good Weight Loss: 0 Weight Gain: 0 Sleep: Decreased Total Hours of Sleep: 5 Vegetative Symptoms: None  ADLScreening [facility]) Patient's cognitive ability adequate to safely complete daily activities?: Yes Patient able to express need for assistance with ADLs?: Yes Independently performs ADLs?: Yes (appropriate for developmental age)  Prior Inpatient Therapy Prior Inpatient Therapy: Yes Prior Therapy Dates: 05/2016 [facility] Fac[facility]Provider[facility]s): [facility] Reason for Treatment: bipolar disorder  Prior Outpatient Therapy Prior Outpatient Therapy: No Prior Therapy Dates: na Prior Therapy Facilty/Provider(s): na Reason for Treatment: na Does patient have an ACCT team?: No Does patient have Intensive In-House  Services?  : No Does patient have [facility] services? : No Does patient have P4CC services?: No  ADL Screening (condition at time of admission) Patient's cognitive ability adequate to safely complete daily activities?: Yes Patient able to express need for assistance with ADLs?: Yes Independently performs ADLs?: Yes (appropriate for developmental age)       Abuse/Neglect Assessment (Assessment to be complete while patient is alone) Abuse/Neglect Assessment Can Be Completed: Yes Physical Abuse: Denies Verbal Abuse: Denies Sexual Abuse: Denies Exploitation of patient/patient's resources: Denies Self-Neglect: Denies Values / Beliefs Cultural Requests During Hospitalization: None Spiritual Requests During Hospitalization: None Consults Spiritual Care Consult Needed: No Social Work Consult Needed: No Merchant navy officer (For Healthcare) Does Patient Have a Medical Advance Directive?: No Would patient like information on creating a medical advance directive?: No - Patient declined    Additional Information 1:1 In Past 12 Months?: No CIRT Risk: No Elopement Risk: No Does patient have medical clearance?: Yes     Disposition:  Disposition Initial Assessment Completed for this Encounter: Yes Disposition of Patient: Inpatient treatment program Type of inpatient treatment program: Adult  On Site Evaluation by:   Reviewed with Physician:    [place] 04/06/2017 3:43 AM

## 2017-04-06 NOTE — BH Assessment (Signed)
Patient is to be [facility] by Dr. Toni Amendlapacs.  Attending Physician will be Dr. Johnella MoloneyMcNew.   Patient has been assigned to room 322A, by [facility] Charge Nurse GordonPhyllis.   Intake Paper Work has been signed and placed on patient chart.  ER staff is aware of the admission Rivka Barbara( Glenda, ER Sect.; Dr. Mayford KnifeWilliams, ER MD; Annia Friendlysie, Patient's Nurse & Mertie ClauseJeanelle Patient Access).

## 2017-04-06 NOTE — Tx Team (Signed)
Initial Treatment Plan 04/06/2017 5:21 PM Patrick [name] [id number]    PATIENT STRESSORS: Financial difficulties Medication change or noncompliance Substance abuse   PATIENT STRENGTHS: Ability for insight Average or above average intelligence Capable of independent living Communication skills Supportive family/friends   PATIENT IDENTIFIED PROBLEMS: Depressed 04/06/17  Suicidal 04/06/17   Substance Abuse 04/06/17                 DISCHARGE CRITERIA:  Ability to meet basic life and health needs Improved stabilization in mood, thinking, and/or behavior  PRELIMINARY DISCHARGE PLAN: Outpatient therapy Return to previous living arrangement  PATIENT/FAMILY INVOLVEMENT: This treatment plan has been presented to and reviewed with the patient, Patrick [name], and/or family member,   .  The patient and family have been given the opportunity to ask questions and make suggestions.  Patrick InfanteGwen [name], RN 04/06/2017, 5:21 PM

## 2017-04-06 NOTE — Progress Notes (Signed)
Admission Note:  D: Pt appeared depressed  With  a flat affect.  Pt  denies SI / AVH at this time. Admitted for Alcohol  Detox . Patient  Suicidal with no plan  Stated he had a hard time  Adjusting to life  Drug of choice Alcohol   Voice of drinking 6-8 beers  Daily Voice of being Diagnosis with Major Depression Pt is redirectable and cooperative with assessment.      A: Pt admitted to unit per protocol, skin assessment and search done with Patrick ReaderPhyllis [name]  and no contraband found.  Pt  educated on therapeutic milieu rules. Pt was introduced to milieu by nursing staff.    R: Pt was receptive to education about the milieu .  15 min safety checks started. Patrick research associatewriter offered support

## 2017-04-06 NOTE — Plan of Care (Signed)
Patient oriented to unit. Patient denies SI/HI at this time. Patient's safety is maintained at this time.    Progressing Education: Knowledge of [place] [facility] Education information/materials will improve 04/06/2017 2023 - Progressing by Berkley Harveyeynolds, Eben Choinski I, RN Self-Concept: Ability to disclose and discuss suicidal ideas will improve 04/06/2017 2023 - Progressing by Addison Naegelieynolds, Saraphina Lauderbaugh I, RN Safety: Ability to remain free from injury will improve 04/06/2017 2023 - Progressing by Berkley Harveyeynolds, Caleigha Zale I, RN

## 2017-04-06 NOTE — Consult Note (Signed)
[place] Psychiatry Consult   Reason for Consult: Consult for 32 year old man with history of substance abuse and mood disorder with suicidal thoughts Referring Physician: Jimmye Norman Patient Identification: Patrick [name] MRN:  [id number] Principal Diagnosis: Schizoaffective disorder, depressive type [facility]) Diagnosis:   Patient Active Problem List   Diagnosis Date Noted  . Schizoaffective disorder, bipolar type ([place]) [F25.0] 09/12/2016  . Noncompliance [Z91.19] 08/02/2016  . Suicidal ideation [R45.851] 08/02/2016  . Tobacco use disorder [F17.200] 08/02/2016  . Alcohol withdrawal ([place]) [F10.239] 08/02/2016  . PTSD (post-traumatic stress disorder) [F43.10] 08/02/2016  . Schizoaffective disorder, depressive type ([place]) [F25.1] 08/01/2016  . Cannabis use disorder, moderate, dependence ([place]) [F12.20] 08/01/2016  . Alcohol use disorder, moderate, dependence ([place]) [F10.20] 08/01/2016  . Alcohol use disorder, severe, dependence ([place]) [F10.20] 12/30/2013  . Generalized anxiety disorder [F41.1] 12/30/2012    Total Time spent with patient: 1 hour  Subjective:   Patrick [name] is a 32 y.o. male patient admitted with "I am having a lot of suicidal thoughts".  HPI: Patient interviewed chart reviewed.  32 year old man with a history of schizophrenia or schizoaffective disorder and substance abuse.  He says he has been drinking heavily.  Probably 8 or more of the 24 ounce beers a day.  Last drink was last night.  He is using drugs intermittently not necessarily every day.  For at least the last 2 days he is having suicidal thoughts with thoughts of cutting himself or jumping into traffic.  His anxiety has been very high.  He cannot sleep at night.  Thoughts are racing.  Denies any hallucinations or homicidal ideation.  He is staying with his "fianc" since getting out of prison a few weeks ago.  Has not been able to find a job.  He says he is still taking Zoloft 150 mg/day which she was given in prison.   He is afraid of having a seizure and feels like he needs detox as well.  Social history: Recently out of prison.  Few resources.  Staying with a woman but not working.  Medical history: Patient has a history of alcohol use disorder with complications of it but otherwise no chronic medical problems.  Substance abuse history: History of abuse mostly of alcohol and cannabis.  He says he has had seizures in the past.  No documented delirium tremens.  Most of his sobriety tends to be when he is locked up.  Past Psychiatric History: Patient has had several prior hospitalizations I believe at least 2 of them this past year.  He was treated with Seroquel when he was in the hospital last time which seem to be of some benefit.  Typically not very compliant outside the hospital.  He does have a history of serious suicide attempts including one hanging  Risk to Self:   Risk to Others:   Prior Inpatient Therapy:   Prior Outpatient Therapy:    Past Medical History:  Past Medical History:  Diagnosis Date  . Generalized anxiety disorder   . Major depressive disorder   . Schizoaffective disorder [facility])     Past Surgical History:  Procedure Laterality Date  . SPLENECTOMY     Family History: No family history on file. Family Psychiatric  History: He does have a history of mental health problems and substance abuse in his family Social History:  Social History   Substance and Sexual Activity  Alcohol Use Yes   Comment: Occ     Social History   Substance and Sexual Activity  Drug  Use Yes  . Types: Marijuana   Comment: long ago pain    Social History   Socioeconomic History  . Marital status: Single    Spouse name: Not on file  . Number of children: Not on file  . Years of education: Not on file  . Highest education level: Not on file  Social Needs  . Financial resource strain: Not on file  . Food insecurity - worry: Not on file  . Food insecurity - inability: Not on file  .  Transportation needs - medical: Not on file  . Transportation needs - non-medical: Not on file  Occupational History  . Not on file  Tobacco Use  . Smoking status: Current Every Day Smoker    Packs/day: 2.00    Years: 15.00    Pack years: 30.00    Types: Cigarettes  . Smokeless tobacco: Never Used  Substance and Sexual Activity  . Alcohol use: Yes    Comment: Occ  . Drug use: Yes    Types: Marijuana    Comment: long ago pain  . Sexual activity: Yes  Other Topics Concern  . Not on file  Social History Narrative  . Not on file   Additional Social History:    Allergies:   Allergies  Allergen Reactions  . Sulfa Antibiotics Anaphylaxis  . Antihistamines, Chlorpheniramine-Type Hives  . Diphenhydramine Hcl Palpitations    Labs:  Results for orders placed or performed during the hospital encounter of 04/05/17 (from the past 48 hour(s))  Ethanol     Status: Abnormal   Collection Time: 04/05/17  6:45 PM  Result Value Ref Range   Alcohol, Ethyl (B) 17 (H) <10 mg/dL    Comment:        LOWEST DETECTABLE LIMIT FOR SERUM ALCOHOL IS 10 mg/dL FOR MEDICAL PURPOSES ONLY Performed at [facility], [address], [place], [place] [address]   Salicylate level     Status: None   Collection Time: 04/05/17  6:45 PM  Result Value Ref Range   Salicylate Lvl <7.3 2.8 - 30.0 mg/dL    Comment: Performed at [facility], [address] [place], [place] [address]  Acetaminophen level     Status: Abnormal   Collection Time: 04/05/17  6:45 PM  Result Value Ref Range   Acetaminophen (Tylenol), Serum <10 (L) 10 - 30 ug/mL    Comment:        THERAPEUTIC CONCENTRATIONS VARY SIGNIFICANTLY. A RANGE OF 10-30 ug/mL MAY BE AN EFFECTIVE CONCENTRATION FOR MANY PATIENTS. HOWEVER, SOME ARE BEST TREATED AT CONCENTRATIONS OUTSIDE THIS RANGE. ACETAMINOPHEN CONCENTRATIONS >150 ug/mL AT 4 HOURS AFTER INGESTION AND >50 ug/mL AT 12 HOURS AFTER INGESTION ARE OFTEN ASSOCIATED WITH  TOXIC REACTIONS. Performed at [facility], [address] [place], [place] [address]   cbc     Status: Abnormal   Collection Time: 04/05/17  6:45 PM  Result Value Ref Range   WBC 10.1 3.8 - 10.6 K/uL   RBC 5.15 4.40 - 5.90 MIL/uL   Hemoglobin 14.6 13.0 - 18.0 g/dL   HCT 43.6 40.0 - 52.0 %   MCV 84.8 80.0 - 100.0 fL   MCH 28.4 26.0 - 34.0 pg   MCHC 33.5 32.0 - 36.0 g/dL   RDW 14.3 11.5 - 14.5 %   Platelets 456 (H) 150 - 440 K/uL    Comment: Performed at [facility], [address], [place], [place] [address]  Comprehensive metabolic panel     Status:  Abnormal   Collection Time: 04/05/17  6:45 PM  Result Value Ref Range   Sodium 136 135 - 145 mmol/L   Potassium 3.3 (L) 3.5 - 5.1 mmol/L   Chloride 100 (L) 101 - 111 mmol/L   CO2 24 22 - 32 mmol/L   Glucose, Bld 101 (H) 65 - 99 mg/dL   BUN 16 6 - 20 mg/dL   Creatinine, Ser 0.74 0.61 - 1.24 mg/dL   Calcium 9.5 8.9 - 10.3 mg/dL   Total Protein 7.9 6.5 - 8.1 g/dL   Albumin 4.7 3.5 - 5.0 g/dL   AST 25 15 - 41 U/L   ALT 15 (L) 17 - 63 U/L   Alkaline Phosphatase 73 38 - 126 U/L   Total Bilirubin 0.4 0.3 - 1.2 mg/dL   GFR calc non Af Amer >60 >60 mL/min   GFR calc Af Amer >60 >60 mL/min    Comment: (NOTE) The eGFR has been calculated using the CKD EPI equation. This calculation has not been validated in all clinical situations. eGFR's persistently <60 mL/min signify possible Chronic Kidney Disease.    Anion gap 12 5 - 15    Comment: Performed at [facility], [address] [place], [place] [address]  Urine Drug Screen, Qualitative     Status: None   Collection Time: 04/05/17  7:20 PM  Result Value Ref Range   Tricyclic, Ur Screen NONE DETECTED NONE DETECTED   Amphetamines, Ur Screen NONE DETECTED NONE DETECTED   MDMA (Ecstasy)Ur Screen NONE DETECTED NONE DETECTED   Cocaine Metabolite,Ur [place] NONE DETECTED NONE DETECTED   Opiate, Ur Screen NONE DETECTED NONE DETECTED   Phencyclidine (PCP) Ur S  NONE DETECTED NONE DETECTED   Cannabinoid 50 Ng, Ur [place] NONE DETECTED NONE DETECTED   Barbiturates, Ur Screen NONE DETECTED NONE DETECTED   Benzodiazepine, Ur Scrn NONE DETECTED NONE DETECTED   Methadone Scn, Ur NONE DETECTED NONE DETECTED    Comment: (NOTE) Tricyclics + metabolites, urine    Cutoff 1000 ng/mL Amphetamines + metabolites, urine  Cutoff 1000 ng/mL MDMA (Ecstasy), urine              Cutoff 500 ng/mL Cocaine Metabolite, urine          Cutoff 300 ng/mL Opiate + metabolites, urine        Cutoff 300 ng/mL Phencyclidine (PCP), urine         Cutoff 25 ng/mL Cannabinoid, urine                 Cutoff 50 ng/mL Barbiturates + metabolites, urine  Cutoff 200 ng/mL Benzodiazepine, urine              Cutoff 200 ng/mL Methadone, urine                   Cutoff 300 ng/mL The urine drug screen provides only a preliminary, unconfirmed analytical test result and should not be used for non-medical purposes. Clinical consideration and professional judgment should be applied to any positive drug screen result due to possible interfering substances. A more specific alternate chemical method must be used in order to obtain a confirmed analytical result. Gas chromatography / mass spectrometry (GC/MS) is the preferred confirmat ory method. Performed at [facility], [address] [place],  [address]     Current Facility-Administered Medications  Medication Dose Route Frequency Provider Last Rate Last Dose  . acetaminophen (TYLENOL) tablet 650 mg  650 mg Oral Q6H PRN Damiya Sandefur, Madie Reno, MD      .  alum & mag hydroxide-simeth (MAALOX/MYLANTA) 200-200-20 MG/5ML suspension 30 mL  30 mL Oral Q4H PRN Jacque Byron T, MD      . folic acid (FOLVITE) tablet 1 mg  1 mg Oral Daily Andrw Mcguirt T, MD      . LORazepam (ATIVAN) tablet 1 mg  1 mg Oral Q6H PRN Keilyn Nadal, Madie Reno, MD       Or  . LORazepam (ATIVAN) injection 1 mg  1 mg Intravenous Q6H PRN Parish Dubose T, MD      . magnesium  hydroxide (MILK OF MAGNESIA) suspension 30 mL  30 mL Oral Daily PRN Enid Maultsby T, MD      . multivitamin with minerals tablet 1 tablet  1 tablet Oral Daily Shaya Altamura T, MD      . nicotine (NICODERM CQ - dosed in mg/24 hours) patch 21 mg  21 mg Transdermal Daily Natalyah Cummiskey T, MD      . QUEtiapine (SEROQUEL) tablet 200 mg  200 mg Oral QHS Bradi Arbuthnot T, MD      . thiamine (VITAMIN B-1) tablet 100 mg  100 mg Oral Daily Reiley Bertagnolli T, MD       Or  . thiamine (B-1) injection 100 mg  100 mg Intravenous Daily Shalae Belmonte T, MD      . traZODone (DESYREL) tablet 100 mg  100 mg Oral QHS PRN Vivian Neuwirth, Madie Reno, MD        Musculoskeletal: Strength & Muscle Tone: within normal limits Gait & Station: normal Patient leans: N/A  Psychiatric Specialty Exam: Physical Exam  Nursing note and vitals reviewed. Constitutional: He appears well-developed and well-nourished.  HENT:  Head: Normocephalic and atraumatic.  Eyes: Conjunctivae are normal. Pupils are equal, round, and reactive to light.  Neck: Normal range of motion.  Cardiovascular: Regular rhythm and normal heart sounds.  Respiratory: Effort normal. No respiratory distress.  GI: Soft.  Musculoskeletal: Normal range of motion.  Neurological: He is alert.  Skin: Skin is warm and dry.  Psychiatric: His speech is delayed. He is slowed and withdrawn. Cognition and memory are impaired. He expresses impulsivity. He exhibits a depressed mood. He expresses suicidal ideation. He expresses suicidal plans.    Review of Systems  Constitutional: Negative.   HENT: Negative.   Eyes: Negative.   Respiratory: Negative.   Cardiovascular: Negative.   Gastrointestinal: Negative.   Musculoskeletal: Negative.   Skin: Negative.   Neurological: Negative.   Psychiatric/Behavioral: Positive for depression, memory loss, substance abuse and suicidal ideas. Negative for hallucinations. The patient is nervous/anxious and has insomnia.     There were no  vitals taken for this visit.There is no height or weight on file to calculate BMI.  General Appearance: Disheveled and Very disheveled and malodorous  Eye Contact:  Minimal  Speech:  Slow  Volume:  Decreased  Mood:  Depressed and Dysphoric  Affect:  Flat  Thought Process:  Goal Directed  Orientation:  Full (Time, Place, and Person)  Thought Content:  Paranoid Ideation, Rumination and Tangential  Suicidal Thoughts:  Yes.  with intent/plan  Homicidal Thoughts:  No  Memory:  Immediate;   Fair Recent;   Poor Remote;   Fair  Judgement:  Impaired  Insight:  Fair  Psychomotor Activity:  Decreased  Concentration:  Concentration: Poor  Recall:  Poor  Fund of Knowledge:  Fair  Language:  Fair  Akathisia:  No  Handed:  Right  AIMS (if indicated):     Assets:  Desire for Improvement  Physical Health Resilience  ADL's:  Impaired  Cognition:  Impaired,  Mild  Sleep:        Treatment Plan Summary: Daily contact with patient to assess and evaluate symptoms and progress in treatment, Medication management and Plan 32 year old man who came in with depressed mood active suicidal ideation.  Clearly not taking care of himself well.  Alcohol level was only 17 when he came in but he says he has been drinking heavily.  Meets criteria for admission to the psychiatric unit.  Primary diagnosis of schizoaffective disorder depressive type also needs alcohol withdrawal.  Orders completed for admission to the unit downstairs.  Patient agreeable to the plan.  Disposition: Recommend psychiatric Inpatient admission when medically cleared. Supportive therapy provided about ongoing stressors.  Alethia Berthold, MD 04/06/2017 4:04 PM

## 2017-04-07 DIAGNOSIS — F331 Major depressive disorder, recurrent, moderate: Principal | ICD-10-CM

## 2017-04-07 LAB — HEMOGLOBIN A1C
Hgb A1c MFr Bld: 4.9 % (ref 4.8–5.6)
MEAN PLASMA GLUCOSE: 93.93 mg/dL

## 2017-04-07 LAB — LIPID PANEL
CHOLESTEROL: 149 mg/dL (ref 0–200)
HDL: 61 mg/dL (ref >40)
LDL Cholesterol: 70 mg/dL (ref 0–99)
TRIGLYCERIDES: 90 mg/dL (ref <150)
Total CHOL/HDL Ratio: 2.4 RATIO
VLDL: 18 mg/dL (ref 0–40)

## 2017-04-07 LAB — TSH: TSH: 1.905 u[IU]/mL (ref 0.350–4.500)

## 2017-04-07 MED ORDER — SERTRALINE HCL 100 MG PO TABS
150.0000 mg | ORAL_TABLET | Freq: Every day | ORAL | Status: DC
  Administered 2017-04-07 – 2017-04-08 (×2): 150 mg via ORAL
  Filled 2017-04-07 (×2): qty 2

## 2017-04-07 MED ORDER — HYDROXYZINE HCL 50 MG PO TABS
50.0000 mg | ORAL_TABLET | Freq: Three times a day (TID) | ORAL | Status: DC | PRN
  Administered 2017-04-09 – 2017-04-10 (×3): 50 mg via ORAL
  Filled 2017-04-07 (×3): qty 1

## 2017-04-07 MED ORDER — GABAPENTIN 600 MG PO TABS
900.0000 mg | ORAL_TABLET | Freq: Three times a day (TID) | ORAL | Status: DC
  Administered 2017-04-07 – 2017-04-08 (×4): 900 mg via ORAL
  Filled 2017-04-07 (×5): qty 2

## 2017-04-07 NOTE — Progress Notes (Signed)
D: Patient denies SI/HI/AVH. Patient verbally contracts for safety. Patient has complaint of agitation and anxiety. Patient is observed pacing in hallway. Patient is seen in milieu interacting with peers. Patient is needy, and asks multiple times for PRN ativan. Patient educated about the CIWA monitoring protocol.   A: Patient was assessed by this nurse. Patient was oriented to unit. Patient's safety was maintained on unit. Q x 15 minute observation checks were completed for safety. Patient care plan was reviewed. Patient was offered support and encouragement. Patient was encourage to attend groups, participate in unit activities and continue with plan of care.   R: Patient has no complaints of pain at this time. Patient is receptive to treatment and safety maintained on unit.

## 2017-04-07 NOTE — [facility] Counselor (Signed)
Adult Comprehensive Assessment  Patient ID: Catarino Vold, male   DOB: 1985/10/24, 32 y.o.   MRN: [id number]  Information Source: Information source: Patient  Current Stressors:  Educational / Learning stressors: None reported Employment / Job issues: Pt is currently unemployed.  Family Relationships: No issues reported.  Financial / Lack of resources (include bankruptcy): Pt reports having no income at this time.  Housing / Lack of housing: Pt reports living with his fiance and being able to return.  Physical health (include injuries & life threatening diseases): No issues reported.  Social relationships: No issues reported.  Substance abuse: Pt reports drinking 5-6 24oz beers daily for the last three weeks.  Bereavement / Loss: No issues reported.   Living/Environment/Situation:  Living Arrangements: Spouse/significant other, Children Living conditions (as described by patient or guardian): Pt reports living with his fiance and their 67 month old daughter.  How long has patient lived in current situation?: for 3 weeks.  What is atmosphere in current home: Comfortable  Family History:  Marital status: Other (comment)(engaged) Are you sexually active?: Yes What is your sexual orientation?: Heterosexual  Has your sexual activity been affected by drugs, alcohol, medication, or emotional stress?: Pt denies.  Does patient have children?: Yes How many children?: 1 How is patient's relationship with their children?: Pt reports having a good relationship with his 91 month old daughter.   Childhood History:  By whom was/is the patient raised?: Mother Additional childhood history information: Pt reports his father died when pt was 68 years old.  Description of patient's relationship with caregiver when they were a child: Pt reports having a good relationship with his mother.  Patient's description of current relationship with people who raised him/her: Pt reports having a good relationship  with his mother currently as well.  How were you disciplined when you got in trouble as a child/adolescent?: Physical discipline.  Does patient have siblings?: No Did patient suffer any verbal/emotional/physical/sexual abuse as a child?: No Did patient suffer from severe childhood neglect?: No Has patient ever been sexually abused/assaulted/raped as an adolescent or adult?: No Was the patient ever a victim of a crime or a disaster?: No Witnessed domestic violence?: No Has patient been effected by domestic violence as an adult?: No  Education:  Highest grade of school patient has completed: 2 years of college Currently a student?: No Name of school: N/A Learning disability?: No  Employment/Work Situation:   Employment situation: Unemployed Patient's job has been impacted by current illness: No What is the longest time patient has a held a job?: Pt unable to answer--Pt has been in and out of prison so has not held a job recently.  Where was the patient employed at that time?: N/A Has patient ever been in the [employer]?: No Has patient ever served in combat?: No Did You Receive Any Psychiatric Treatment/Services While in the [employer]?: No Are There Guns or Other Weapons in Your Home?: No Are These Comptroller?: (N/A) Who Could Verify You Are Able To Have These Secured:: N/A  Financial Resources:   Financial resources: No income, Income from spouse Does patient have a Lawyer or guardian?: No  Alcohol/Substance Abuse:   What has been your use of drugs/alcohol within the last 12 months?: Pt reports drinking 5-6 24oz beers daily for the last three weeks since being released from prison.  If attempted suicide, did drugs/alcohol play a role in this?: Yes(Pt reports +SI with multiple plans ) Alcohol/Substance Abuse Treatment Hx: Past Tx, Inpatient  If yes, describe treatment: Pt reports previous alcohol/subtance use tx with [facility], [facility], [facility], and a rehab in [place]  called [facility].  Has alcohol/substance abuse ever caused legal problems?: Yes(Pt has been incarcerated three different times (4 eyars, 18 months, and 8 months).)  Social Support System:   Patient's Community Support System: Good Describe Community Support System: Pt reports his fiance "keeps me grounded."  Type of faith/religion: Pt is not religious.  How does patient's faith help to cope with current illness?: N/A  Leisure/Recreation:   Leisure and Hobbies: "Playing with my daughter and watching her change and grow."   Strengths/Needs:   What things does the patient do well?: Community support, motivation for change In what areas does patient struggle / problems for patient: substance use, SI   Discharge Plan:   Does patient have access to transportation?: Yes Will patient be returning to same living situation after discharge?: Yes Currently receiving community mental health services: No If no, would patient like referral for services when discharged?: Yes (What county?)([place]) Does patient have financial barriers related to discharge medications?: Yes Patient description of barriers related to discharge medications: Pt currently has no income.   Summary/Recommendations:   Summary and Recommendations (to be completed by the evaluator): Pt is a 32 year old male who presents to [facility] due to +SI with multiple plans, and for detox from alcohol. Pt reports, "I just got out of prison three weeks ago, and I've been drinking a lot. My fiance gave me an ultimatum."  Pt reports +SI with a plan to cut self or jump into traffic. Pt denies HI or AVH. Pt reports he was provided with a 90 day supply of meds following his release from prison, and states he has been adherent with these medications. Pt reports living with his fiance and daughter and being able to return upon discharge. Pt reports drinking 5-6 24oz beers daily but denies other substance use. Pt denies trauma or abuse history. Pt's  diagnosis is Schizoaffective Disorder. Recommendations for this patient include: crisis stabilization, therapeutic milieu, encouragement to attend and participate in group therapy, and the development of a comprehensive mental wellness and recovery plan.    Heidi DachKelsey Hillel Card, LCSW. 04/07/2017, [id number]

## 2017-04-07 NOTE — Progress Notes (Signed)
Recreation Therapy Notes  Date: 01.18.2019  Time: 1:00 PM  Location: Craft Room  Behavioral response: Appropriate   Intervention Topic: Emotions  Discussion/Intervention: Group content on today was focused on emotions. The group identified what emotions are and why it is important to have emotions. Patients expressed some positive and negative emotions. Individuals gave some past experiences on how they normally dealt with emotions in the past. The group described some positive ways to deal with emotions in the future. Patients participated in the intervention "what a day" where individuals were given a chance to respond to certain situations involving their emotions. Clinical Observations/Feedback:  Patient came to group and defined emotions as the way you feel. He stated a positive way to deal with emotions is to talk to someone. Individual participated in the intervention na d was social with peers and  Staff during group.   Cortlandt Capuano LRT/CTRS         Tirth Cothron 04/07/2017 2:43 PM

## 2017-04-07 NOTE — Tx Team (Signed)
Interdisciplinary Treatment and Diagnostic Plan Update  04/07/2017 Time of Session: 1100 Patrick [name] MRN: [id number]  Principal Diagnosis: Schizoaffective disorder, depressive type (HCC)  Secondary Diagnoses: Principal Problem:   Schizoaffective disorder, depressive type (HCC) Active Problems:   Alcohol use disorder, moderate, dependence (HCC)   Tobacco use disorder   Alcohol withdrawal (HCC)   Alcohol use disorder, severe, dependence (HCC)   Current Medications:  Current Facility-Administered Medications  Medication Dose Route Frequency Provider Last Rate Last Dose  . acetaminophen (TYLENOL) tablet 650 mg  650 mg Oral Q6H PRN Clapacs, Jackquline Denmark, MD   650 mg at 04/07/17 0225  . alum & mag hydroxide-simeth (MAALOX/MYLANTA) 200-200-20 MG/5ML suspension 30 mL  30 mL Oral Q4H PRN Clapacs, John T, MD      . folic acid (FOLVITE) tablet 1 mg  1 mg Oral Daily Clapacs, Jackquline Denmark, MD   1 mg at 04/07/17 0825  . gabapentin (NEURONTIN) tablet 900 mg  900 mg Oral TID McNew, Ileene Hutchinson, MD      . hydrOXYzine (ATARAX/VISTARIL) tablet 50 mg  50 mg Oral TID PRN McNew, Ileene Hutchinson, MD      . LORazepam (ATIVAN) tablet 1 mg  1 mg Oral Q6H PRN Clapacs, Jackquline Denmark, MD   1 mg at 04/07/17 0825  . magnesium hydroxide (MILK OF MAGNESIA) suspension 30 mL  30 mL Oral Daily PRN Clapacs, John T, MD      . multivitamin with minerals tablet 1 tablet  1 tablet Oral Daily Clapacs, Jackquline Denmark, MD   1 tablet at 04/07/17 0825  . nicotine (NICODERM CQ - dosed in mg/24 hours) patch 21 mg  21 mg Transdermal Daily Clapacs, Jackquline Denmark, MD   21 mg at 04/07/17 0825  . sertraline (ZOLOFT) tablet 150 mg  150 mg Oral Daily McNew, Holly R, MD      . thiamine (VITAMIN B-1) tablet 100 mg  100 mg Oral Daily Clapacs, John T, MD   100 mg at 04/07/17 0825   Or  . thiamine (B-1) injection 100 mg  100 mg Intravenous Daily Clapacs, John T, MD      . traZODone (DESYREL) tablet 100 mg  100 mg Oral QHS PRN Clapacs, Jackquline Denmark, MD   100 mg at 04/06/17 2130   PTA  Medications: Medications Prior to Admission  Medication Sig Dispense Refill Last Dose  . QUEtiapine (SEROQUEL) 100 MG tablet Take 1 tablet (100 mg total) by mouth 3 (three) times daily. 90 tablet 1   . QUEtiapine (SEROQUEL) 200 MG tablet Take 1 tablet (200 mg total) by mouth at bedtime. 30 tablet 1     Patient Stressors: Financial difficulties Medication change or noncompliance Substance abuse  Patient Strengths: Ability for insight Average or above average intelligence Capable of independent living Communication skills Supportive family/friends  Treatment Modalities: Medication Management, Group therapy, Case management,  1 to 1 session with clinician, Psychoeducation, Recreational therapy.   Physician Treatment Plan for Primary Diagnosis: Schizoaffective disorder, depressive type (HCC) Long Term Goal(s):     Short Term Goals:    Medication Management: Evaluate patient's response, side effects, and tolerance of medication regimen.  Therapeutic Interventions: 1 to 1 sessions, Unit Group sessions and Medication administration.  Evaluation of Outcomes: Progressing  Physician Treatment Plan for Secondary Diagnosis: Principal Problem:   Schizoaffective disorder, depressive type (HCC) Active Problems:   Alcohol use disorder, moderate, dependence (HCC)   Tobacco use disorder   Alcohol withdrawal (HCC)   Alcohol use disorder, severe, dependence (HCC)  Long Term Goal(s):     Short Term Goals:       Medication Management: Evaluate patient's response, side effects, and tolerance of medication regimen.  Therapeutic Interventions: 1 to 1 sessions, Unit Group sessions and Medication administration.  Evaluation of Outcomes: Progressing   RN Treatment Plan for Primary Diagnosis: Schizoaffective disorder, depressive type (HCC) Long Term Goal(s): Knowledge of disease and therapeutic regimen to maintain health will improve  Short Term Goals: Ability to verbalize feelings will  improve, Ability to identify and develop effective coping behaviors will improve and Compliance with prescribed medications will improve  Medication Management: RN will administer medications as ordered by provider, will assess and evaluate patient's response and provide education to patient for prescribed medication. RN will report any adverse and/or side effects to prescribing provider.  Therapeutic Interventions: 1 on 1 counseling sessions, Psychoeducation, Medication administration, Evaluate responses to treatment, Monitor vital signs and CBGs as ordered, Perform/monitor CIWA, COWS, AIMS and Fall Risk screenings as ordered, Perform wound care treatments as ordered.  Evaluation of Outcomes: Progressing   LCSW Treatment Plan for Primary Diagnosis: Schizoaffective disorder, depressive type (HCC) Long Term Goal(s): Safe transition to appropriate next level of care at discharge, Engage patient in therapeutic group addressing interpersonal concerns.  Short Term Goals: Engage patient in aftercare planning with referrals and resources, Identify triggers associated with mental health/substance abuse issues and Increase skills for wellness and recovery  Therapeutic Interventions: Assess for all discharge needs, 1 to 1 time with Social worker, Explore available resources and support systems, Assess for adequacy in community support network, Educate family and significant other(s) on suicide prevention, Complete Psychosocial Assessment, Interpersonal group therapy.  Evaluation of Outcomes: Progressing   Progress in Treatment: Attending groups: Yes. Participating in groups: Yes. Taking medication as prescribed: Yes. Toleration medication: Yes. Family/Significant other contact made: Yes, individual(s) contacted:  with pt's fiance. Patient understands diagnosis: Yes. Discussing patient identified problems/goals with staff: Yes. Medical problems stabilized or resolved: Yes. Denies suicidal/homicidal  ideation: Yes. Issues/concerns per patient self-inventory: No. Other: None at this time.   New problem(s) identified: No, Describe:  none at this time.   New Short Term/Long Term Goal(s): Pt reports his goal for treatment is to, "find a new combination of medications that works better for anxiety and depression. I also want to learn coping skills to help with the meds so I don't end up self medicating. I also need to learn how to interact with people in a non-incarcerated setting."   Discharge Plan or Barriers: Pt will be discharged home and will be referred for outpatient tx with [facility] in [place], [place].   Reason for Continuation of Hospitalization: Anxiety Depression Medication stabilization  Estimated Length of Stay: 3-5 days  Recreational Therapy: Patient Stressors: Family, Relationship, Work, Alcoholism Patient Goal: Patient will find 3 new coping skills to use post-discharge x5 days.   Attendees: Patient: Patrick [name] 04/07/2017 11:11 AM  Physician: Dr. Johnella MoloneyMcNew, MD 04/07/2017 11:11 AM  Nursing:  [name], RN 04/07/2017 11:11 AM  RN Care Manager: 04/07/2017 11:11 AM  Social Worker: Heidi DachKelsey Craig, LCSW 04/07/2017 11:11 AM  Recreational Therapist: Garret ReddishShay Takyah Ciaramitaro, CTRS-LRT 04/07/2017 11:11 AM  Other: Johny Shearsassandra Jarrett, LCSWA 04/07/2017 11:11 AM  Other:  04/07/2017 11:11 AM  Other: 04/07/2017 11:11 AM    Scribe for Treatment Team: Heidi DachKelsey Craig, LCSW 04/07/2017 11:11 AM

## 2017-04-07 NOTE — [facility] Group Notes (Signed)
  04/07/2017  Time: 0930  Type of Therapy and Topic:  Group Therapy:  Feelings around Relapse and Recovery  Participation Level:  Did Not Attend   Description of Group:    Patients in this group will discuss emotions they experience before and after a relapse. They will process how experiencing these feelings, or avoidance of experiencing them, relates to having a relapse. Facilitator will guide patients to explore emotions they have related to recovery. Patients will be encouraged to process which emotions are more powerful. They will be guided to discuss the emotional reaction significant others in their lives may have to their relapse or recovery. Patients will be assisted in exploring ways to respond to the emotions of others without this contributing to a relapse.  Therapeutic Goals: 1. Patient will identify two or more emotions that lead to a relapse for them 2. Patient will identify two emotions that result when they relapse 3. Patient will identify two emotions related to recovery 4. Patient will demonstrate ability to communicate their needs through discussion and/or role plays   Summary of Patient Progress: Pt was invited to attend group but chose not to attend. CSW will continue to encourage pt to attend group throughout their admission.    Therapeutic Modalities:   Cognitive Behavioral Therapy Solution-Focused Therapy Assertiveness Training Relapse Prevention Therapy  Heidi DachKelsey Basia Mcginty, MSW, LCSW 04/07/2017 10:24 AM

## 2017-04-07 NOTE — H&P (Signed)
Psychiatric Admission Assessment Adult  Patient Identification: Celvin Taney MRN:  [id number] Date of Evaluation:  04/07/2017 Chief Complaint:  Depression Principal Diagnosis: MDD (major depressive disorder), recurrent episode, moderate (HCC) Diagnosis:   Patient Active Problem List   Diagnosis Date Noted  . Schizoaffective disorder, bipolar type (HCC) [F25.0] 09/12/2016  . Noncompliance [Z91.19] 08/02/2016  . Suicidal ideation [R45.851] 08/02/2016  . Tobacco use disorder [F17.200] 08/02/2016  . Alcohol withdrawal (HCC) [F10.239] 08/02/2016  . PTSD (post-traumatic stress disorder) [F43.10] 08/02/2016  . Schizoaffective disorder, depressive type (HCC) [F25.1] 08/01/2016  . Cannabis use disorder, moderate, dependence (HCC) [F12.20] 08/01/2016  . Alcohol use disorder, moderate, dependence (HCC) [F10.20] 08/01/2016  . Alcohol use disorder, severe, dependence (HCC) [F10.20] 12/30/2013  . Generalized anxiety disorder [F41.1] 12/30/2012   History of Present Illness: 32 yo male admitted due to worsening depression and suicidal thoughts. Pt states that he has struggled with anxiety off and on for many years. He states that when he gets into a depressive episode he gets very fatigued, its very difficult to get anything done, hard to get out of bed. These usually last 2-3 days and then he slowly comes out of it. He has done various stints of prison time. He got out recently after doing 8 months. He was on Zoloft during that time and was helpful for anxiety and depression. He states that "the depression isn't completely gone but it is helping a lot." he states that he has found the transition from prison to society difficult for him. He states that he was in a prison gang and had a sense of community there but now "I am just an ordinary person." He feels that he does not have much of a purpose. He has been gradually feeling more depressed and was starting have suicidal thoughts with plan to walk into  traffic. He states, "I don't want to kill myself but I do." He states that he has been self medicating with alcohol. He drinks about 6-7 24oz beers that are 8 percent alcohol. He states that he feels tremulous in the morning so will have a beer int he morning to feel better but usually only drinks at night. He does report history of w/d seizures in the past. He is living with his fiance and they recently got approved for a new house in [place] so will be moving there soon. They have a 11 month old daughter which keeps him going. He is not interested in residential treatment or even IOP because he needs to get a job to help his wife pay the bills. He does not like AA because of the religious component.  He states that he would ike help detoxing and help with anxiety. He states that he has always struggled with anxiety and "I'm always wound up real tight."   Associated Signs/Symptoms: Depression Symptoms:  depressed mood, anhedonia, fatigue, hopelessness, suicidal thoughts without plan, (Hypo) Manic Symptoms:  Denies history of manic symptoms including decreased need for sleep, euphoria, risky behviaors when NOT using substances Anxiety Symptoms:  Excessive Worry, Panic Symptoms, Psychotic Symptoms:  Denies AH, VH, Paranoia PTSD Symptoms: Negative Total Time spent with patient: 1 hour  Past Psychiatric History: History of MDD and GAD. He does not have outpatient providers currently. He denies past suicide attempts but has history of self harm by cutting. Last cut about 3 years ago. He has had history of hospitalizations.   Is the patient at risk to self? Yes.    Has the patient been  a risk to self in the past 6 months? No.  Has the patient been a risk to self within the distant past? No.  Is the patient a risk to others? No.  Has the patient been a risk to others in the past 6 months? No.  Has the patient been a risk to others within the distant past? No. but has had charges of assault but he  states, "i didn't do anything."    Alcohol Screening: 1. How often do you have a drink containing alcohol?: 4 or more times a week 2. How many drinks containing alcohol do you have on a typical day when you are drinking?: 10 or more 3. How often do you have six or more drinks on one occasion?: Daily or almost daily AUDIT-C Score: 12 5. How often during the last year have you failed to do what was normally expected from you becasue of drinking?: Less than monthly 6. How often during the last year have you needed a first drink in the morning to get yourself going after a heavy drinking session?: Less than monthly 7. How often during the last year have you had a feeling of guilt of remorse after drinking?: Never 8. How often during the last year have you been unable to remember what happened the night before because you had been drinking?: Never 9. Have you or someone else been injured as a result of your drinking?: No 10. Has a relative or friend or a doctor or another health worker been concerned about your drinking or suggested you cut down?: No Alcohol Use Disorder Identification Test Final Score (AUDIT): 14 Intervention/Follow-up: Alcohol Education, Continued Monitoring, Medication Offered/Prescribed Substance Abuse History in the last 12 months:  Yes.  , alcohol use. Uses Xanax off the streets about 1 mg a day, MJ-occasionally. He has done residential rehab at least twice in the past. He has had over 1 year of sobriety. He has history of acid use, heroin use.  Consequences of Substance Abuse: Medical Consequences:  Seizures Legal Consequences:  prison Previous Psychotropic Medications: Yes  Psychological Evaluations: Yes  Past Medical History:  Past Medical History:  Diagnosis Date  . Generalized anxiety disorder   . Major depressive disorder   . Schizoaffective disorder [facility](HCC)     Past Surgical History:  Procedure Laterality Date  . SPLENECTOMY     Family History: History reviewed.  No pertinent family history. Family Psychiatric  History: Unknown Tobacco Screening: Have you used any form of tobacco in the last 30 days? (Cigarettes, Smokeless Tobacco, Cigars, and/or Pipes): Yes Tobacco use, Select all that apply: 5 or more cigarettes per day Are you interested in Tobacco Cessation Medications?: No, patient refused Counseled patient on smoking cessation including recognizing danger situations, developing coping skills and basic information about quitting provided: Refused/Declined practical counseling Social History: From [place], [place]. Currently lives in [place] but moving to [place]. He has been with his fiance for 13 years. They have a 914 month old daughter together. He is not working currently. He has done various stints in prison. Age 32 for 3.5 years for armed robbery, again for 1 year for conspiracy to commit armed robbery, kidnapping and assault with a deadly weapon, recently for 8 month for his parole. He does not have any more legal issues currently.   Additional Social History: Marital status: Other (comment)(engaged) Are you sexually active?: Yes What is your sexual orientation?: Heterosexual  Has your sexual activity been affected by drugs, alcohol, medication, or emotional  stress?: Pt denies.  Does patient have children?: Yes How many children?: 1 How is patient's relationship with their children?: Pt reports having a good relationship with his 65 month old daughter.                          Allergies:   Allergies  Allergen Reactions  . Sulfa Antibiotics Anaphylaxis  . Antihistamines, Chlorpheniramine-Type Hives  . Diphenhydramine Hcl Palpitations   Lab Results:  Results for orders placed or performed during the hospital encounter of 04/06/17 (from the past 48 hour(s))  Hemoglobin A1c     Status: None   Collection Time: 04/07/17  6:56 AM  Result Value Ref Range   Hgb A1c MFr Bld 4.9 4.8 - 5.6 %    Comment: (NOTE) Pre diabetes:           5.7%-6.4% Diabetes:              >6.4% Glycemic control for   <7.0% adults with diabetes    Mean Plasma Glucose 93.93 mg/dL    Comment: Performed at [facility], [address], [place], [place] [address]  Lipid panel     Status: None   Collection Time: 04/07/17  6:56 AM  Result Value Ref Range   Cholesterol 149 0 - 200 mg/dL   Triglycerides 90 <604 mg/dL   HDL 61 >54 mg/dL   Total CHOL/HDL Ratio 2.4 RATIO   VLDL 18 0 - 40 mg/dL   LDL Cholesterol 70 0 - 99 mg/dL    Comment:        Total Cholesterol/HDL:CHD Risk Coronary Heart Disease Risk Table                     Men   Women  1/2 Average Risk   3.4   3.3  Average Risk       5.0   4.4  2 X Average Risk   9.6   7.1  3 X Average Risk  23.4   11.0        Use the calculated Patient Ratio above and the CHD Risk Table to determine the patient's CHD Risk.        ATP III CLASSIFICATION (LDL):  <100     mg/dL   Optimal  098-119  mg/dL   Near or Above                    Optimal  130-159  mg/dL   Borderline  147-829  mg/dL   High  >562     mg/dL   Very High Performed at [facility], [address] [place], [place] [address]   TSH     Status: None   Collection Time: 04/07/17  6:56 AM  Result Value Ref Range   TSH 1.905 0.350 - 4.500 uIU/mL    Comment: Performed by a 3rd Generation assay with a functional sensitivity of <=0.01 uIU/mL. Performed at [facility], [address], [place], [place] [address]     Blood Alcohol level:  Lab Results  Component Value Date   ETH 17 (H) 04/05/2017   ETH 200 (H) 09/10/2016    Metabolic Disorder Labs:  Lab Results  Component Value Date   HGBA1C 4.9 04/07/2017   MPG 93.93 04/07/2017   MPG 97 08/02/2016   Lab Results  Component Value Date   PROLACTIN 21.0 (H) 08/02/2016   Lab Results  Component Value Date  CHOL 149 04/07/2017   TRIG 90 04/07/2017   HDL 61 04/07/2017   CHOLHDL 2.4 04/07/2017   VLDL 18 04/07/2017   LDLCALC 70 04/07/2017    LDLCALC 114 (H) 08/02/2016    Current Medications: Current Facility-Administered Medications  Medication Dose Route Frequency Provider Last Rate Last Dose  . acetaminophen (TYLENOL) tablet 650 mg  650 mg Oral Q6H PRN Clapacs, Jackquline Denmark, MD   650 mg at 04/07/17 0225  . alum & mag hydroxide-simeth (MAALOX/MYLANTA) 200-200-20 MG/5ML suspension 30 mL  30 mL Oral Q4H PRN Clapacs, John T, MD      . folic acid (FOLVITE) tablet 1 mg  1 mg Oral Daily Clapacs, Jackquline Denmark, MD   1 mg at 04/07/17 0825  . gabapentin (NEURONTIN) tablet 900 mg  900 mg Oral TID Aubrea Meixner, Ileene Hutchinson, MD      . hydrOXYzine (ATARAX/VISTARIL) tablet 50 mg  50 mg Oral TID PRN Arslan Kier, Ileene Hutchinson, MD      . LORazepam (ATIVAN) tablet 1 mg  1 mg Oral Q6H PRN Clapacs, Jackquline Denmark, MD   1 mg at 04/07/17 0825  . magnesium hydroxide (MILK OF MAGNESIA) suspension 30 mL  30 mL Oral Daily PRN Clapacs, John T, MD      . multivitamin with minerals tablet 1 tablet  1 tablet Oral Daily Clapacs, Jackquline Denmark, MD   1 tablet at 04/07/17 0825  . nicotine (NICODERM CQ - dosed in mg/24 hours) patch 21 mg  21 mg Transdermal Daily Clapacs, Jackquline Denmark, MD   21 mg at 04/07/17 0825  . sertraline (ZOLOFT) tablet 150 mg  150 mg Oral Daily Ople Girgis R, MD      . thiamine (VITAMIN B-1) tablet 100 mg  100 mg Oral Daily Clapacs, John T, MD   100 mg at 04/07/17 0825   Or  . thiamine (B-1) injection 100 mg  100 mg Intravenous Daily Clapacs, John T, MD      . traZODone (DESYREL) tablet 100 mg  100 mg Oral QHS PRN Clapacs, Jackquline Denmark, MD   100 mg at 04/06/17 2130   PTA Medications: Medications Prior to Admission  Medication Sig Dispense Refill Last Dose  . QUEtiapine (SEROQUEL) 100 MG tablet Take 1 tablet (100 mg total) by mouth 3 (three) times daily. 90 tablet 1   . QUEtiapine (SEROQUEL) 200 MG tablet Take 1 tablet (200 mg total) by mouth at bedtime. 30 tablet 1     Musculoskeletal: Strength & Muscle Tone: within normal limits Gait & Station: normal Patient leans: N/A  Psychiatric  Specialty Exam: Physical Exam  ROS  Blood pressure 133/82, pulse 76, temperature 97.9 F (36.6 C), temperature source Oral, resp. rate 18, height 5\' 10"  (1.778 m), weight 84.4 kg (186 lb).Body mass index is 26.69 kg/m.  General Appearance: Casual  Eye Contact:  Fair  Speech:  Clear and Coherent  Volume:  Normal  Mood:  Depressed  Affect:  Congruent  Thought Process:  Coherent and Goal Directed  Orientation:  Full (Time, Place, and Person)  Thought Content:  Logical  Suicidal Thoughts:  Yes.  with intent/plan  Homicidal Thoughts:  No  Memory:  Immediate;   Fair  Judgement:  Fair  Insight:  Fair  Psychomotor Activity:  Normal  Concentration:  Concentration: Fair  Recall:  [employer] of Knowledge:  Fair  Language:  Fair  Akathisia:  No      Assets:  Communication Skills Desire for Improvement Housing Resilience Social Support  ADL's:  Intact  Cognition:  WNL  Sleep:       Treatment Plan Summary: 32 yo male admitted due to worsening depression and SI, also with alcohol abuse. He is having difficulty transitioning from prison to everyday life. He has past diagnosis in the chart of schizoaffective disorder but has very long history of substance abuse clouding this diagnosis. He does not appear manic or psychotic at all currently and this diagnosis was likely in the context of drug use (used cocaine, acid and heroin heavily in the past).   Plan:  MDD/Anxiety Delfin Edis Zoloft 150 mg daily -Gabapentin 900 mg TID  Alcohol abuse/W/D -CIWA with prn ativan -Gabapentin 900 mg TID -Pt not interested in residential or IOP  Dispo -Pt will discharge home when stable and follow up with [facility]  Observation Level/Precautions:  15 minute checks  Laboratory:  Done in ED  Psychotherapy:    Medications:    Consultations:    Discharge Concerns:    Estimated LOS: 3-5  Other:     Physician Treatment Plan for Primary Diagnosis: MDD (major depressive disorder), recurrent episode,  moderate (HCC) Long Term Goal(s): Improvement in symptoms so as ready for discharge  Short Term Goals: Ability to disclose and discuss suicidal ideas and Ability to demonstrate self-control will improve  Physician Treatment Plan for Secondary Diagnosis: Principal Problem:   MDD (major depressive disorder), recurrent episode, moderate (HCC) Active Problems:   Alcohol use disorder, moderate, dependence (HCC)   Alcohol withdrawal (HCC)   Tobacco use disorder   Alcohol use disorder, severe, dependence (HCC)    I certify that inpatient services furnished can reasonably be expected to improve the patient's condition.    Haskell Riling, MD 1/18/20191:34 PM

## 2017-04-07 NOTE — Plan of Care (Signed)
Patient verbalizes understanding of the general information that's been provided to him. Patient has the ability to cope and function at an adequate level. Patient denies HI/AVH at this time. Patient endorses passive SI without a plan, but does feel safe on the unit and contracts for safety at this time. Patient has attended some unit groups today and has not had any issues thus far. Patient has remained free from injury on the unit and is safe at this time.

## 2017-04-07 NOTE — Plan of Care (Signed)
Patient denise SI/HI, Safety is maintained on unit. Patient is oriented to unit.    Progressing Education: Knowledge of [facility] Education information/materials will improve 04/07/2017 2028 - Progressing by Addison Naegelieynolds, Caniyah Murley I, RN Self-Concept: Ability to disclose and discuss suicidal ideas will improve 04/07/2017 2028 - Progressing by Addison Naegelieynolds, Nusaybah Ivie I, RN Safety: Ability to remain free from injury will improve 04/07/2017 2028 - Progressing by Berkley Harveyeynolds, Jeslie Lowe I, RN

## 2017-04-07 NOTE — [facility] Suicide Risk Assessment (Signed)
[facility] INPATIENT:  Family/Significant Other Suicide Prevention Education  Suicide Prevention Education:  Education Completed; Patrick [name], pt's fiance, at [phone]  has been identified by the patient as the family member/significant other with whom the patient will be residing, and identified as the person(s) who will aid the patient in the event of a mental health crisis (suicidal ideations/suicide attempt).  With written consent from the patient, the family member/significant other has been provided the following suicide prevention education, prior to the and/or following the discharge of the patient.  The suicide prevention education provided includes the following:  Suicide risk factors  Suicide prevention and interventions  National Suicide Hotline telephone number  [facility] assessment telephone number  [facility] Emergency Assistance 911  [facility] and/or Residential Mobile Crisis Unit telephone number  Request made of family/significant other to:  Remove weapons (e.g., guns, rifles, knives), all items previously/currently identified as safety concern.    Remove drugs/medications (over-the-counter, prescriptions, illicit drugs), all items previously/currently identified as a safety concern.  The family member/significant other verbalizes understanding of the suicide prevention education information provided.  The family member/significant other agrees to remove the items of safety concern listed above.  Pt's fiance added, "The biggest concern is that I haven't heard from him or spoken to him since he was admitted. As far as I know, he's just been having a rough time mentally. There's been a lot of uncertainty going on at home. We recently found out that I was approved to moved into a home--which definitely helped some things. He was also recently released from prison. He's just having a hard time getting back into society." Pt's fiance reported pt is able  to return home upon discharge, and will not have access to weapons/other means of self harm.   Patrick DachKelsey Kazaria Gaertner, LCSW 04/07/2017, 9:15 AM

## 2017-04-07 NOTE — [facility] Suicide Risk Assessment (Signed)
[facility]   Nursing information obtained from:    Demographic factors:    Current Mental Status:    Loss Factors:    Historical Factors:    Risk Reduction Factors:     Total Time spent with patient: 1 hour Principal Problem: MDD (major depressive disorder), recurrent episode, moderate (HCC) Diagnosis:   Patient Active Problem List   Diagnosis Date Noted  . MDD (major depressive disorder), recurrent episode, moderate (HCC) [F33.1] 04/07/2017    Priority: High  . Alcohol withdrawal (HCC) [F10.239] 08/02/2016    Priority: Medium  . Alcohol use disorder, moderate, dependence (HCC) [F10.20] 08/01/2016    Priority: Medium  . Noncompliance [Z91.19] 08/02/2016  . Suicidal ideation [R45.851] 08/02/2016  . Tobacco use disorder [F17.200] 08/02/2016  . PTSD (post-traumatic stress disorder) [F43.10] 08/02/2016  . Cannabis use disorder, moderate, dependence (HCC) [F12.20] 08/01/2016  . Alcohol use disorder, severe, dependence (HCC) [F10.20] 12/30/2013  . Generalized anxiety disorder [F41.1] 12/30/2012   Subjective Data: See H7p  Continued Clinical Symptoms:  Alcohol Use Disorder Identification Test Final Score (AUDIT): 14 The "Alcohol Use Disorders Identification Test", Guidelines for Use in Primary Care, Second Edition.  World Science writerHealth Organization [facility](WHO). Score between 0-7:  no or low risk or alcohol related problems. Score between 8-15:  moderate risk of alcohol related problems. Score between 16-19:  high risk of alcohol related problems. Score 20 or above:  warrants further diagnostic evaluation for alcohol dependence and treatment.   CLINICAL FACTORS:   Depression:   Anhedonia Hopelessness     COGNITIVE FEATURES THAT CONTRIBUTE TO RISK:  None    SUICIDE RISK:   Moderate:  Frequent suicidal ideation with limited intensity, and duration, some specificity in terms of plans, no associated intent, good self-control, limited dysphoria/symptomatology, some  risk factors present, and identifiable protective factors, including available and accessible social support.  PLAN OF CARE: See H&P  I certify that inpatient services furnished can reasonably be expected to improve the patient's condition.   Haskell RilingHolly R Adelina Collard, MD 04/07/2017, 1:54 PM

## 2017-04-07 NOTE — Progress Notes (Signed)
Recreation Therapy Notes  INPATIENT RECREATION THERAPY ASSESSMENT  Patient Details Name: Patrick [name] MRN: [id number] DOB: 02/21/1986 Today's Date: 04/07/2017  Patient Stressors: Family, Relationship, Other (Comment), Work(Alcoholism)  Coping Skills:   Isolate, Arguments, Substance Abuse, Avoidance, Music  Personal Challenges: Anger, Communication, Concentration, Decision-Making, Problem-Solving, Relationships, Self-Esteem/Confidence, Stress Management, Substance Abuse, Trusting Others  Leisure Interests (2+):  Social - Family, Games - Video games  Awareness of Community Resources:  Yes  Community Resources:     Current Use: No  If no, Barriers?: Financial  Patient Strengths:  Smart, keeping calm  Patient Identified Areas of Improvement:  anger, Impulse, substance use, motivation  Current Recreation Participation:  walk  Patient Goal for Hospitalization:  Find a combination of medication that reduces anxiety ,and deoression and find new coping skills  [place]  County [place] Residence:  [place]   Current SI (including self-harm):  Yes  Current HI:  No  Consent to Intern Participation: N/A   Patrick [name] 04/07/2017, 3:14 PM

## 2017-04-07 NOTE — Progress Notes (Signed)
D- Patient alert and oriented. Patient presents in an anxious mood on assessment stating that "I don't feel good because I'm going through withdrawals" rating his anxiety a "8/10". Patient reports passive SI without a plan, but states he feels safe here on the unit and contracts for safety at this time. Patient denies HI/AVH and pain at this time.   A- Scheduled medications administered to patient, per MD orders. Support and encouragement provided.  Routine safety checks conducted every 15 minutes.  Patient informed to notify staff with problems or concerns.  R- No adverse drug reactions noted. Patient contracts for safety at this time. Patient compliant with medications and treatment plan. Patient receptive, calm, and cooperative. Patient interacts well with others on the unit.  Patient remains safe at this time.

## 2017-04-07 NOTE — Progress Notes (Signed)
CH present for treatment team meeting. Patient stated he would like to learn coping skills and social skills to reintegrate back into society after being incarcerated. CH asked about what county he would be receiving services. CH gave resource to social worker Psychologist, forensic([facility] [place]) to assist patient with meeting his goals once discharged.

## 2017-04-08 MED ORDER — PANTOPRAZOLE SODIUM 40 MG PO TBEC
40.0000 mg | DELAYED_RELEASE_TABLET | Freq: Every day | ORAL | Status: DC
  Administered 2017-04-08 – 2017-04-11 (×4): 40 mg via ORAL
  Filled 2017-04-08 (×4): qty 1

## 2017-04-08 MED ORDER — ONDANSETRON HCL 4 MG/2ML IJ SOLN: 4.0000 mg | Freq: Three times a day (TID) | INTRAMUSCULAR | Status: DC | PRN

## 2017-04-08 MED ORDER — LORAZEPAM 2 MG/ML IJ SOLN: 1.0000 mg | INTRAMUSCULAR | Status: AC

## 2017-04-08 MED ORDER — LORAZEPAM 1 MG PO TABS: 1.0000 mg | ORAL_TABLET | ORAL | Status: AC

## 2017-04-08 MED ORDER — SERTRALINE HCL 100 MG PO TABS
100.0000 mg | ORAL_TABLET | Freq: Every day | ORAL | Status: DC
  Administered 2017-04-09 – 2017-04-11 (×3): 100 mg via ORAL
  Filled 2017-04-08 (×3): qty 1

## 2017-04-08 MED ORDER — GABAPENTIN 600 MG PO TABS
600.0000 mg | ORAL_TABLET | Freq: Three times a day (TID) | ORAL | Status: DC
  Administered 2017-04-08 – 2017-04-11 (×9): 600 mg via ORAL
  Filled 2017-04-08 (×10): qty 1

## 2017-04-08 MED ORDER — ONDANSETRON HCL 4 MG PO TABS
4.0000 mg | ORAL_TABLET | Freq: Three times a day (TID) | ORAL | Status: DC | PRN
  Administered 2017-04-08: 4 mg via ORAL
  Filled 2017-04-08 (×2): qty 1

## 2017-04-08 MED ORDER — LORAZEPAM 2 MG/ML IJ SOLN
INTRAMUSCULAR | Status: AC
  Administered 2017-04-08: 16:00:00
  Filled 2017-04-08: qty 1

## 2017-04-08 NOTE — [facility] Group Notes (Signed)
LCSW Group Therapy Note   04/08/2017 1:15pm   Type of Therapy and Topic:  Group Therapy:  Trust and Honesty  Participation Level:  Minimal  Description of Group:    In this group patients will be asked to explore the value of being honest.  Patients will be guided to discuss their thoughts, feelings, and behaviors related to honesty and trusting in others. Patients will process together how trust and honesty relate to forming relationships with peers, family members, and self. Each patient will be challenged to identify and express feelings of being vulnerable. Patients will discuss reasons why people are dishonest and identify alternative outcomes if one was truthful (to self or others). This group will be process-oriented, with patients participating in exploration of their own experiences, giving and receiving support, and processing challenge from other group members.   Therapeutic Goals: 1. Patient will identify why honesty is important to relationships and how honesty overall affects relationships.  2. Patient will identify a situation where they lied or were lied too and the  feelings, thought process, and behaviors surrounding the situation 3. Patient will identify the meaning of being vulnerable, how that feels, and how that correlates to being honest with self and others. 4. Patient will identify situations where they could have told the truth, but instead lied and explain reasons of dishonesty.   Summary of Patient Progress; Pt identified why honesty is important to relationships and how honesty overall affects relationships. He shared how dishonesty had affected his life and relationships. Pt described a situation where he lied and was able to process his thoughts and feelings about that situations. Pt. Actively participated in group discussion.       Therapeutic Modalities:   Cognitive Behavioral Therapy Solution Focused Therapy Motivational Interviewing Brief Therapy  Patrick [name]   [name], LCSW 04/08/2017 12:57 PM

## 2017-04-08 NOTE — Plan of Care (Signed)
Patient alert and oriented x 4. Complained of increased anxiety, patient present in the milieu with selective peer interaction noted. Compliant with meals and medication. Complained of increased anxiety with some relief with a prn Ativan. CIWA scoring conducted, last score 5. Denies SI/HI/AVH at this time. Milieu remains safe with q 15 minute safety checks.   

## 2017-04-08 NOTE — Progress Notes (Signed)
Patient observed by staff hitting the wall in his bathroom with R) hand, no apparent injury to be noted. MD notified of issue, one time dose of Ativan  1 mg IM or PO ordered. Due to the mental distress that patient was in, IM injection administered in L) deltoid. Will continue to monitor.

## 2017-04-08 NOTE — Progress Notes (Addendum)
[facility] MD Progress Note  04/08/2017 1:35 PM Patrick [name]  MRN:  [id number] Subjective:    32 yo male admitted due to worsening depression and SI, also with alcohol abuse. He is having difficulty transitioning from prison to everyday life. He has past diagnosis in the chart of schizoaffective disorder but has very long history of substance abuse. Pt continue to endorse anxiety, depression, nausea, body ache, CIWA-4. Pt interacting with staffs. Pt continue to endorse SI with plan of " whole lot of options", but not in the hospital     Principal Problem: MDD (major depressive disorder), recurrent episode, moderate (HCC) Diagnosis:   Patient Active Problem List   Diagnosis Date Noted  . MDD (major depressive disorder), recurrent episode, moderate (HCC) [F33.1] 04/07/2017  . Noncompliance [Z91.19] 08/02/2016  . Suicidal ideation [R45.851] 08/02/2016  . Tobacco use disorder [F17.200] 08/02/2016  . Alcohol withdrawal (HCC) [F10.239] 08/02/2016  . PTSD (post-traumatic stress disorder) [F43.10] 08/02/2016  . Cannabis use disorder, moderate, dependence (HCC) [F12.20] 08/01/2016  . Alcohol use disorder, moderate, dependence (HCC) [F10.20] 08/01/2016  . Alcohol use disorder, severe, dependence (HCC) [F10.20] 12/30/2013  . Generalized anxiety disorder [F41.1] 12/30/2012   Total Time spent with patient: 25 min  Past Psychiatric History: no new info  Past Medical History:  Past Medical History:  Diagnosis Date  . Generalized anxiety disorder   . Major depressive disorder   . Schizoaffective disorder [facility])     Past Surgical History:  Procedure Laterality Date  . SPLENECTOMY     Family History: History reviewed. No pertinent family history. Family Psychiatric  History: no ne winfo Social History:  Social History   Substance and Sexual Activity  Alcohol Use Yes   Comment: Occ     Social History   Substance and Sexual Activity  Drug Use Yes  . Types: Marijuana   Comment: long ago  pain    Social History   Socioeconomic History  . Marital status: Single    Spouse name: None  . Number of children: None  . Years of education: None  . Highest education level: None  Social Needs  . Financial resource strain: None  . Food insecurity - worry: None  . Food insecurity - inability: None  . Transportation needs - medical: None  . Transportation needs - non-medical: None  Occupational History  . None  Tobacco Use  . Smoking status: Current Every Day Smoker    Packs/day: 2.00    Years: 15.00    Pack years: 30.00    Types: Cigarettes  . Smokeless tobacco: Never Used  Substance and Sexual Activity  . Alcohol use: Yes    Comment: Occ  . Drug use: Yes    Types: Marijuana    Comment: long ago pain  . Sexual activity: Yes  Other Topics Concern  . None  Social History Narrative  . None   Additional Social History:                         Sleep: Fair  Appetite:  Poor  Current Medications: Current Facility-Administered Medications  Medication Dose Route Frequency Provider Last Rate Last Dose  . acetaminophen (TYLENOL) tablet 650 mg  650 mg Oral Q6H PRN Clapacs, Jackquline Denmark, MD   650 mg at 04/07/17 2141  . alum & mag hydroxide-simeth (MAALOX/MYLANTA) 200-200-20 MG/5ML suspension 30 mL  30 mL Oral Q4H PRN Clapacs, Jackquline Denmark, MD      . folic acid (FOLVITE) tablet  1 mg  1 mg Oral Daily Clapacs, Jackquline DenmarkJohn T, MD   1 mg at 04/08/17 0840  . gabapentin (NEURONTIN) tablet 900 mg  900 mg Oral TID Haskell RilingMcNew, Holly R, MD   900 mg at 04/08/17 1120  . hydrOXYzine (ATARAX/VISTARIL) tablet 50 mg  50 mg Oral TID PRN Haskell RilingMcNew, Holly R, MD      . LORazepam (ATIVAN) tablet 1 mg  1 mg Oral Q6H PRN Clapacs, Jackquline DenmarkJohn T, MD   1 mg at 04/08/17 1232  . magnesium hydroxide (MILK OF MAGNESIA) suspension 30 mL  30 mL Oral Daily PRN Clapacs, John T, MD      . multivitamin with minerals tablet 1 tablet  1 tablet Oral Daily Clapacs, Jackquline DenmarkJohn T, MD   1 tablet at 04/08/17 0839  . nicotine (NICODERM CQ - dosed in  mg/24 hours) patch 21 mg  21 mg Transdermal Daily Clapacs, Jackquline DenmarkJohn T, MD   21 mg at 04/08/17 [id number]  . sertraline (ZOLOFT) tablet 150 mg  150 mg Oral Daily McNew, Ileene HutchinsonHolly R, MD   150 mg at 04/08/17 0840  . thiamine (VITAMIN B-1) tablet 100 mg  100 mg Oral Daily Clapacs, John T, MD   100 mg at 04/07/17 0825   Or  . thiamine (B-1) injection 100 mg  100 mg Intravenous Daily Clapacs, Jackquline DenmarkJohn T, MD   100 mg at 04/08/17 [id number]  . traZODone (DESYREL) tablet 100 mg  100 mg Oral QHS PRN Clapacs, Jackquline DenmarkJohn T, MD   100 mg at 04/07/17 2141    Lab Results:  Results for orders placed or performed during the hospital encounter of 04/06/17 (from the past 48 hour(s))  Hemoglobin A1c     Status: None   Collection Time: 04/07/17  6:56 AM  Result Value Ref Range   Hgb A1c MFr Bld 4.9 4.8 - 5.6 %    Comment: (NOTE) Pre diabetes:          5.7%-6.4% Diabetes:              >6.4% Glycemic control for   <7.0% adults with diabetes    Mean Plasma Glucose 93.93 mg/dL    Comment: Performed at [facility], [address] [place], [place] [address]  Lipid panel     Status: None   Collection Time: 04/07/17  6:56 AM  Result Value Ref Range   Cholesterol 149 0 - 200 mg/dL   Triglycerides 90 <119<150 mg/dL   HDL 61 >14>40 mg/dL   Total CHOL/HDL Ratio 2.4 RATIO   VLDL 18 0 - 40 mg/dL   LDL Cholesterol 70 0 - 99 mg/dL    Comment:        Total Cholesterol/HDL:CHD Risk Coronary Heart Disease Risk Table                     Men   Women  1/2 Average Risk   3.4   3.3  Average Risk       5.0   4.4  2 X Average Risk   9.6   7.1  3 X Average Risk  23.4   11.0        Use the calculated Patient Ratio above and the CHD Risk Table to determine the patient's CHD Risk.        ATP III CLASSIFICATION (LDL):  <100     mg/dL   Optimal  [id number]  mg/dL   Near or Above  Optimal  130-159  mg/dL   Borderline  161-096  mg/dL   High  >045     mg/dL   Very High Performed at [facility], [address]  [place], [place] [address]   TSH     Status: None   Collection Time: 04/07/17  6:56 AM  Result Value Ref Range   TSH 1.905 0.350 - 4.500 uIU/mL    Comment: Performed by a 3rd Generation assay with a functional sensitivity of <=0.01 uIU/mL. Performed at [facility], [address] [place], [place] [address]     Blood Alcohol level:  Lab Results  Component Value Date   ETH 17 (H) 04/05/2017   ETH 200 (H) 09/10/2016    Metabolic Disorder Labs: Lab Results  Component Value Date   HGBA1C 4.9 04/07/2017   MPG 93.93 04/07/2017   MPG 97 08/02/2016   Lab Results  Component Value Date   PROLACTIN 21.0 (H) 08/02/2016   Lab Results  Component Value Date   CHOL 149 04/07/2017   TRIG 90 04/07/2017   HDL 61 04/07/2017   CHOLHDL 2.4 04/07/2017   VLDL 18 04/07/2017   LDLCALC 70 04/07/2017   LDLCALC 114 (H) 08/02/2016    Physical Findings: AIMS:  , ,  ,  ,    CIWA:  CIWA-Ar Total: 5 COWS:     Musculoskeletal: Strength & Muscle Tone: within normal limits Gait & Station: normal Patient leans:   Psychiatric Specialty Exam: Physical Exam  Nursing note and vitals reviewed.   ROS  Blood pressure 113/68, pulse 71, temperature 98.7 F (37.1 C), temperature source Oral, resp. rate 16, height 5\' 10"  (1.778 m), weight 84.4 kg (186 lb), SpO2 99 %.Body mass index is 26.69 kg/m.   General Appearance: Casual  Eye Contact:  Fair  Speech:  Clear and Coherent  Volume:  Normal  Mood:  Depressed  Affect:  Congruent  Thought Process:  Coherent and Goal Directed  Orientation:  Full (Time, Place, and Person)  Thought Content:  Logical  Suicidal Thoughts:  Yes.  with  " whole lot of options"  Homicidal Thoughts:  No  Memory:  Immediate;   Fair  Judgement:  Fair  Insight:  Fair  Psychomotor Activity:  Normal  Concentration:  Concentration: Fair  Recall:  [employer] of Knowledge:  Fair  Language:  Fair  Akathisia:  No      Assets:  Communication Skills Desire for  Improvement Housing Resilience Social Support  ADL's:  Intact  Cognition:  WNL  Sleep:          Treatment Plan Summary: 32 yo male admitted due to worsening depression and SI, also with alcohol abuse. He is having difficulty transitioning from prison to everyday life. He has past diagnosis in the chart of schizoaffective disorder but has very long history of substance abuse clouding this diagnosis. He does not appear manic or psychotic at all currently and this diagnosis was likely in the context of drug use (used cocaine, acid and heroin heavily in the past).  Pt depressed, suicidal and reports nausea, will decrease meds due to nausea, will give Zoloft with food. Add protonix, add prn zofran.   Plan:  SI- contracts for safety in hospital;  treating  depression  MDD/Anxiety _Restarted Zoloft  and Gabapentin , lower dose due to nausea  Alcohol abuse/W/D -CIWA with prn ativan -Gabapentin 900 mg TID -Pt not interested in residential or IOP  Dispo -Pt will discharge home when stable  and follow up with [facility]  Observation Level/Precautions:  15 minute checks  Laboratory:  Done in ED  Psychotherapy:    Medications:    Consultations:    Discharge Concerns:    Estimated LOS: 3-5  Other:     Physician Treatment Plan for Primary Diagnosis: MDD (major depressive disorder), recurrent episode, moderate (HCC) Long Term Goal(s): Improvement in symptoms so as ready for discharge  Short Term Goals: Ability to disclose and discuss suicidal ideas and Ability to demonstrate self-control will improve  Physician Treatment Plan for Secondary Diagnosis: Principal Problem:   MDD (major depressive disorder), recurrent episode, moderate (HCC) Active Problems:   Alcohol use disorder, moderate, dependence (HCC)   Alcohol withdrawal (HCC)   Tobacco use disorder   Alcohol use disorder, severe, dependence (HCC)    I certify that inpatient services furnished can reasonably be expected  to improve the patient's condition.      Beverly Sessions, MD 04/08/2017, 1:35 PM

## 2017-04-08 NOTE — Plan of Care (Signed)
  Progressing Coping: Ability to cope will improve 04/08/2017 2010 - Progressing by [name], [name], RN Self-Concept: Ability to disclose and discuss suicidal ideas will improve 04/08/2017 2010 - Progressing by [name], [name], RN Physical Regulation: Complications related to the disease process, condition or treatment will be avoided or minimized 04/08/2017 2010 - Progressing by [name], [name], RN [facility] Participation in Recreation Therapeutic Interventions STG-Other Recreation Therapy Goal (Specify) Description Patient will find 3 new coping skills to use post-discharge x5 days.   04/08/2017 2010 - Progressing by [name], [name], RN

## 2017-04-08 NOTE — Progress Notes (Signed)
Patient alert and oriented x 4. Complained of increased anxiety, patient present in the milieu with selective peer interaction noted. Compliant with meals and medication. Complained of increased anxiety with some relief with a prn Ativan. CIWA scoring conducted, last score 5. Denies SI/HI/AVH at this time. Milieu remains safe with q 15 minute safety checks.

## 2017-04-09 ENCOUNTER — Inpatient Hospital Stay: Payer: No Typology Code available for payment source

## 2017-04-09 ENCOUNTER — Encounter: Payer: Self-pay | Admitting: Psychiatry

## 2017-04-09 MED ORDER — LORAZEPAM 1 MG PO TABS
1.0000 mg | ORAL_TABLET | Freq: Once | ORAL | Status: AC
  Administered 2017-04-09: 1 mg via ORAL
  Filled 2017-04-09: qty 1

## 2017-04-09 MED ORDER — OLANZAPINE 5 MG PO TABS: 5.0000 mg | ORAL_TABLET | Freq: Four times a day (QID) | ORAL | Status: DC | PRN

## 2017-04-09 NOTE — [facility] Group Notes (Signed)
LCSW Group Therapy Note 04/09/2017 1:15pm  Type of Therapy and Topic: Group Therapy: Feelings Around Returning Home & Establishing a Supportive Framework and Supporting Oneself When Supports Not Available  Participation Level: Active  Description of Group:  Patients first processed thoughts and feelings about upcoming discharge. These included fears of upcoming changes, lack of change, new living environments, judgements and expectations from others and overall stigma of mental health issues. The group then discussed the definition of a supportive framework, what that looks and feels like, and how do to discern it from an unhealthy non-supportive network. The group identified different types of supports as well as what to do when your family/friends are less than helpful or unavailable  Therapeutic Goals  1. Patient will identify one healthy supportive network that they can use at discharge. 2. Patient will identify one factor of a supportive framework and how to tell it from an unhealthy network. 3. Patient able to identify one coping skill to use when they do not have positive supports from others. 4. Patient will demonstrate ability to communicate their needs through discussion and/or role plays.  Summary of Patient Progress:  Pt scored his mood 3.5 (10 best). He reported he is not feeling well, he stated he is experiencing symptoms of withdrawal. Pt became agitated and confrontational, did not let CSW guide the group at times, got upset every time the CSW would redirect the group.  Pt engaged during group session. As patients processed their anxiety about discharge and described healthy supports patient shared he is not ready to leave the hospital Patients identified at least one self-care tool they were willing to use after discharge.   Therapeutic Modalities Cognitive Behavioral Therapy Motivational Interviewing   Nohemi Nicklaus  [name], LCSW 04/09/2017 11:23 AM

## 2017-04-09 NOTE — Progress Notes (Signed)
Received Patrick [name] this Am after breakfast, he was compliant with his medication and given Ativan for anxiety and an increased heart rate of 102. Later he returned with C/O feeling anxious and was informed his Ativan has been discontinued.He refused to take the Vistaril, but took the Gabapentin 600 mg and stated the Gabapentin should be 900 mg TID. He paced the hallway for a few minutes then he retrieved to his room after he punched the wall. This Clinical research associatewriter received an order for Zyprexa 5 mg PO, he refused the medication.

## 2017-04-09 NOTE — Progress Notes (Signed)
Pt continues to seek Ativan medication. No s/sx present at this time. Pt came to nursing stations and stated"why did no one awake him" Pt currently CIWA is an 0. Will cont to monitor pt.

## 2017-04-09 NOTE — [facility] Group Notes (Signed)
[facility] Group Notes:  (Nursing/MHT/Case Management/Adjunct)  Date:  04/09/2017  Time:  10:09 PM  Type of Therapy:  Group Therapy  Participation Level:  Active  Participation Quality:  Appropriate  Affect:  Appropriate  Cognitive:  Appropriate  Insight:  Appropriate  Engagement in Group:  Engaged  Modes of Intervention:  Discussion  Summary of Progress/Problems:  Patrick [name] [name] [name] [name] 04/09/2017, 10:09 PM

## 2017-04-09 NOTE — Progress Notes (Signed)
Patient contract for safety of self and others, patient reports depression symptoms but is not visibly noted, reports sadness and difficulties concentrating and feeling apprehension, patient is seen in the common interacting with peers without issues, patient denies any suicide ideations or intent at this time, compliance with medication is good, patient is sleeping long hours with out any disturbances, 15 minute rounds is in effect, no distress noted.

## 2017-04-09 NOTE — Progress Notes (Signed)
[facility] MD Progress Note  04/09/2017 1:38 PM Derwin Reddy  MRN:  [id number] Subjective:    32 yo male admitted due to worsening depression and SI, also with alcohol abuse. He is having difficulty transitioning from prison to everyday life. He has past diagnosis in the chart of schizoaffective disorder but has very long history of substance abuse. Pt continue to endorse anxiety, " panic attacks", unable to identify the trigger, states ativan helps  to " edge off",  endorses depression,  nausea is better. CIWA-1.  Denies SI/HI.    Principal Problem: MDD (major depressive disorder), recurrent episode, moderate (HCC) Diagnosis:   Patient Active Problem List   Diagnosis Date Noted  . MDD (major depressive disorder), recurrent episode, moderate (HCC) [F33.1] 04/07/2017  . Noncompliance [Z91.19] 08/02/2016  . Suicidal ideation [R45.851] 08/02/2016  . Tobacco use disorder [F17.200] 08/02/2016  . Alcohol withdrawal (HCC) [F10.239] 08/02/2016  . PTSD (post-traumatic stress disorder) [F43.10] 08/02/2016  . Cannabis use disorder, moderate, dependence (HCC) [F12.20] 08/01/2016  . Alcohol use disorder, moderate, dependence (HCC) [F10.20] 08/01/2016  . Alcohol use disorder, severe, dependence (HCC) [F10.20] 12/30/2013  . Generalized anxiety disorder [F41.1] 12/30/2012   Total Time spent with patient: 25 min  Past Psychiatric History: no new info  Past Medical History:  Past Medical History:  Diagnosis Date  . Generalized anxiety disorder   . Major depressive disorder   . Schizoaffective disorder [facility])     Past Surgical History:  Procedure Laterality Date  . SPLENECTOMY     Family History: History reviewed. No pertinent family history. Family Psychiatric  History: no ne winfo Social History:  Social History   Substance and Sexual Activity  Alcohol Use Yes   Comment: Occ     Social History   Substance and Sexual Activity  Drug Use Yes  . Types: Marijuana   Comment: long ago pain     Social History   Socioeconomic History  . Marital status: Single    Spouse name: None  . Number of children: None  . Years of education: None  . Highest education level: None  Social Needs  . Financial resource strain: None  . Food insecurity - worry: None  . Food insecurity - inability: None  . Transportation needs - medical: None  . Transportation needs - non-medical: None  Occupational History  . None  Tobacco Use  . Smoking status: Current Every Day Smoker    Packs/day: 2.00    Years: 15.00    Pack years: 30.00    Types: Cigarettes  . Smokeless tobacco: Never Used  Substance and Sexual Activity  . Alcohol use: Yes    Comment: Occ  . Drug use: Yes    Types: Marijuana    Comment: long ago pain  . Sexual activity: Yes  Other Topics Concern  . None  Social History Narrative  . None   Additional Social History:                         Sleep: Fair  Appetite:  Poor  Current Medications: Current Facility-Administered Medications  Medication Dose Route Frequency Provider Last Rate Last Dose  . acetaminophen (TYLENOL) tablet 650 mg  650 mg Oral Q6H PRN Clapacs, Jackquline Denmark, MD   650 mg at 04/09/17 0844  . alum & mag hydroxide-simeth (MAALOX/MYLANTA) 200-200-20 MG/5ML suspension 30 mL  30 mL Oral Q4H PRN Clapacs, John T, MD      . folic acid (FOLVITE) tablet 1  mg  1 mg Oral Daily Clapacs, Jackquline Denmark, MD   1 mg at 04/09/17 0837  . gabapentin (NEURONTIN) tablet 600 mg  600 mg Oral TID Beverly Sessions, MD   600 mg at 04/09/17 1610  . hydrOXYzine (ATARAX/VISTARIL) tablet 50 mg  50 mg Oral TID PRN Haskell Riling, MD      . LORazepam (ATIVAN) tablet 1 mg  1 mg Oral NOW Beverly Sessions, MD       Or  . LORazepam (ATIVAN) injection 1 mg  1 mg Intramuscular NOW Beverly Sessions, MD      . LORazepam (ATIVAN) tablet 1 mg  1 mg Oral Q6H PRN Clapacs, Jackquline Denmark, MD   1 mg at 04/09/17 0837  . magnesium hydroxide (MILK OF MAGNESIA) suspension 30 mL  30 mL Oral Daily PRN Clapacs,  John T, MD      . multivitamin with minerals tablet 1 tablet  1 tablet Oral Daily Clapacs, Jackquline Denmark, MD   1 tablet at 04/09/17 0837  . nicotine (NICODERM CQ - dosed in mg/24 hours) patch 21 mg  21 mg Transdermal Daily Clapacs, Jackquline Denmark, MD   21 mg at 04/09/17 9604  . ondansetron (ZOFRAN) tablet 4 mg  4 mg Oral Q8H PRN Beverly Sessions, MD   4 mg at 04/08/17 1607   Or  . ondansetron (ZOFRAN) injection 4 mg  4 mg Intramuscular Q8H PRN Beverly Sessions, MD      . pantoprazole (PROTONIX) EC tablet 40 mg  40 mg Oral Daily Beverly Sessions, MD   40 mg at 04/09/17 0837  . sertraline (ZOLOFT) tablet 100 mg  100 mg Oral Q breakfast Beverly Sessions, MD   100 mg at 04/09/17 0837  . thiamine (VITAMIN B-1) tablet 100 mg  100 mg Oral Daily Clapacs, John T, MD   100 mg at 04/09/17 5409   Or  . thiamine (B-1) injection 100 mg  100 mg Intravenous Daily Clapacs, Jackquline Denmark, MD   100 mg at 04/08/17 8119  . traZODone (DESYREL) tablet 100 mg  100 mg Oral QHS PRN Clapacs, Jackquline Denmark, MD   100 mg at 04/07/17 2141    Lab Results:  No results found for this or any previous visit (from the past 48 hour(s)).  Blood Alcohol level:  Lab Results  Component Value Date   ETH 17 (H) 04/05/2017   ETH 200 (H) 09/10/2016    Metabolic Disorder Labs: Lab Results  Component Value Date   HGBA1C 4.9 04/07/2017   MPG 93.93 04/07/2017   MPG 97 08/02/2016   Lab Results  Component Value Date   PROLACTIN 21.0 (H) 08/02/2016   Lab Results  Component Value Date   CHOL 149 04/07/2017   TRIG 90 04/07/2017   HDL 61 04/07/2017   CHOLHDL 2.4 04/07/2017   VLDL 18 04/07/2017   LDLCALC 70 04/07/2017   LDLCALC 114 (H) 08/02/2016    Physical Findings: AIMS:  , ,  ,  ,    CIWA:  CIWA-Ar Total: 1 COWS:     Musculoskeletal: Strength & Muscle Tone: within normal limits Gait & Station: normal Patient leans:   Psychiatric Specialty Exam: Physical Exam  Nursing note and vitals reviewed.   ROS  Blood pressure 101/60, pulse 84,  temperature 98.7 F (37.1 C), temperature source Oral, resp. rate 16, height 5\' 10"  (1.778 m), weight 84.4 kg (186 lb), SpO2 99 %.Body mass index is 26.69 kg/m.   General Appearance: Casual  Eye Contact:  Fair  Speech:  Clear and Coherent  Volume:  Normal  Mood: anxiety  Affect:  Congruent, anxious  Thought Process:  Coherent and Goal Directed  Orientation:  Full (Time, Place, and Person)  Thought Content:  Logical  Suicidal Thoughts:  Yes.  with  " whole lot of options"  Homicidal Thoughts:  No  Memory:  Immediate;   Fair  Judgement:  Fair  Insight:  Fair  Psychomotor Activity:  Normal  Concentration:  Concentration: Fair  Recall:  [employer]Fair  Fund of Knowledge:  Fair  Language:  Fair  Akathisia:  No      Assets:  Communication Skills Desire for Improvement Housing Resilience Social Support  ADL's:  Intact  Cognition:  WNL  Sleep:          Treatment Plan Summary: 32 yo male admitted due to worsening depression and SI, also with alcohol abuse. He is having difficulty transitioning from prison to everyday life. He has past diagnosis in the chart of schizoaffective disorder but has very long history of substance abuse clouding this diagnosis. He does not appear manic or psychotic at all currently and this diagnosis was likely in the context of drug use (used cocaine, acid and heroin heavily in the past).  Pt depressed, anxious, .  Plan:  SI- contracts for safety in hospital;  treating  depression  MDD/Anxiety _Restarted Zoloft  and Gabapentin, dose decreased due to nausea   Alcohol abuse/W/D -CIWA with prn ativan -Gabapentin 900 mg TID -Pt not interested in residential or IOP  Dispo -Pt will discharge home when stable and follow up with [facility]  Observation Level/Precautions:  15 minute checks  Laboratory:  Done in ED  Psychotherapy:    Medications:    Consultations:    Discharge Concerns:    Estimated LOS: 3-5  Other:     Physician Treatment Plan for  Primary Diagnosis: MDD (major depressive disorder), recurrent episode, moderate (HCC) Long Term Goal(s): Improvement in symptoms so as ready for discharge  Short Term Goals: Ability to disclose and discuss suicidal ideas and Ability to demonstrate self-control will improve  Physician Treatment Plan for Secondary Diagnosis: Principal Problem:   MDD (major depressive disorder), recurrent episode, moderate (HCC) Active Problems:   Alcohol use disorder, moderate, dependence (HCC)   Alcohol withdrawal (HCC)   Tobacco use disorder   Alcohol use disorder, severe, dependence (HCC)    I certify that inpatient services furnished can reasonably be expected to improve the patient's condition.      Beverly SessionsJagannath Thurston Brendlinger, MD 04/09/2017, 1:38 PMPatient ID: Daymon LarsenWyatt Dominski, male   DOB: 10/21/1985, 32 y.o.   MRN: [id number]

## 2017-04-09 NOTE — Progress Notes (Signed)
Pt was found conscious sitting on the floor in the bathroom near the toilet, he had vomited in the toilet a small amount of the appearance of old food. His pants was wet from unknown fluid from that extended from his groin to below his knees . He had a hematoma on his forehead from hitting it on the wall. He was removed to his bed and his vital signs were 98.6 - 83- 18 127/77 and PO 100 % on room air. The doctor was called and he was given a one time dose of Ativan and escorted to CT without incident.

## 2017-04-10 MED ORDER — GABAPENTIN 600 MG PO TABS: 600.0000 mg | ORAL_TABLET | Freq: Three times a day (TID) | ORAL | 1 refills | Status: DC

## 2017-04-10 MED ORDER — SERTRALINE HCL 100 MG PO TABS: 100.0000 mg | ORAL_TABLET | Freq: Every day | ORAL | 1 refills | Status: DC

## 2017-04-10 MED ORDER — PANTOPRAZOLE SODIUM 40 MG PO TBEC: 40.0000 mg | DELAYED_RELEASE_TABLET | Freq: Every day | ORAL | 1 refills | Status: DC

## 2017-04-10 MED ORDER — TRAZODONE HCL 100 MG PO TABS: 100.0000 mg | ORAL_TABLET | Freq: Every evening | ORAL | 1 refills | Status: DC | PRN

## 2017-04-10 MED ORDER — HYDROXYZINE HCL 50 MG PO TABS: 50.0000 mg | ORAL_TABLET | Freq: Three times a day (TID) | ORAL | 1 refills | Status: DC | PRN

## 2017-04-10 NOTE — [facility] Group Notes (Signed)
[facility] Group Notes:  (Nursing/MHT/Case Management/Adjunct)  Date:  04/10/2017  Time:  3:10 PM  Type of Therapy:  Psychoeducational Skills  Participation Level:  Did Not Attend  Patrick [name] 04/10/2017, 3:10 PM 

## 2017-04-10 NOTE — Plan of Care (Signed)
Patient is alert and oriented X 4. Patient denies SI, HI and AVH. Patient complains of depression. Patient is compliant with medications. Patient's affect today is flat. Patient attends groups and is active. Patient interacts with peers appropriately. Nurse watched patient shaved this morning, patient grateful.  Patient is knowledgeable about medications and uses. Nurse will continue to monitor, safety checks Q 15 minutes will continue. Education: Knowledge of [facility] Education information/materials will improve 04/10/2017 1256 - Progressing by Leamon ArntPowell, Alford Gamero K, RN   Coping: Ability to cope will improve 04/10/2017 1256 - Progressing by Leamon ArntPowell, Bethany Cumming K, RN   Physical Regulation: Complications related to the disease process, condition or treatment will be avoided or minimized 04/10/2017 1256 - Progressing by Leamon ArntPowell, Syvanna Ciolino K, RN   Safety: Ability to remain free from injury will improve 04/10/2017 1256 - Progressing by Leamon ArntPowell, Caidynce Muzyka K, RN

## 2017-04-10 NOTE — Progress Notes (Signed)
Patient spent most of the evening in the dayroom watching football peers. He was cooperative with treatment and was medication compliant on shift.  Patient still reports depression symptoms but is not visibly noted, patient denies any suicidal ideations or intent at this time, patient is currently resting in bed with eyes closed at this time 15 minute rounds is in effect, no behavioral issues to report on shift at this time.

## 2017-04-10 NOTE — [facility] Group Notes (Signed)
04/10/2017 9:30AM  Type of Therapy and Topic:  Group Therapy:  Overcoming Obstacles  Participation Level:  Active    Description of Group:    In this group patients will be encouraged to explore what they see as obstacles to their own wellness and recovery. They will be guided to discuss their thoughts, feelings, and behaviors related to these obstacles. The group will process together ways to cope with barriers, with attention given to specific choices patients can make. Each patient will be challenged to identify changes they are motivated to make in order to overcome their obstacles. This group will be process-oriented, with patients participating in exploration of their own experiences as well as giving and receiving support and challenge from other group members.   Therapeutic Goals: 1. Patient will identify personal and current obstacles as they relate to admission. 2. Patient will identify barriers that currently interfere with their wellness or overcoming obstacles.  3. Patient will identify feelings, thought process and behaviors related to these barriers. 4. Patient will identify two changes they are willing to make to overcome these obstacles:      Summary of Patient Progress Actively and appropriately engaged in the group. Patient was able to provide support and validation to other group members.Patient practiced active listening when interacting with the facilitator and other group members Patient in still in the process of obtaining treatment goals. Patrick [name] spoke about self medicating with alcohol to manage his anxiety. "Nothing else works for me. Only alcohol and Benzos and the psychiatrist don't want to give me benzos that I need."    Therapeutic Modalities:   Cognitive Behavioral Therapy Solution Focused Therapy Motivational Interviewing Relapse Prevention Therapy    Johny ShearsCassandra Crewe Heathman MSW, [facility] HospitalCSWA 04/10/2017 10:39 AM

## 2017-04-10 NOTE — Progress Notes (Signed)
Recreation Therapy Notes   Date: 01.21.2019  Time: 1:00 PM  Location: Craft Room  Behavioral response: Appropriate   Intervention Topic: Goals  Discussion/Intervention: Group content on today was focused on goals. Patients described what goals are and how they define goals. Individuals expressed how they go about setting goals and reaching them. The group identified how important goals are and if they make short term goals to reach long term goals. Patients described how many goals they work on at a time and what affects them not reaching their goal. Individuals described how much time they put into planning and obtaining their goals. The group participated in the intervention "My Goal Board" and made personal goal boards to help them achieve their goal.  Clinical Observations/Feedback:  Patient came to group and defined goals as something that keeps you on point and focused. He was social with peers and staff during group. Individual continues to make progress toward his goals.   Stacyann Mcconaughy LRT/CTRS         Geraline Halberstadt 04/10/2017 2:10 PM

## 2017-04-10 NOTE — Progress Notes (Addendum)
[facility]  04/10/2017 4:00 PM Daymon LarsenWyatt Akridge  MRN:  [id number]  Subjective:   [name] is no longer suicidal but still depressed and "very, very anxious". He no longer scores on CIWA but has been seeking benzodiazepines. There are no symptoms of withdrawal. He tolerates medications well. Head CT scan 04/09/2017 is negative. Discharge tomorrow.   Treatment plan. Alcohol detox completed. Continue Zoloft.  Social/disposition. He will be discharge to home. Follow up with [facility].  Principal Problem: MDD (major depressive disorder), recurrent episode, moderate (HCC) Diagnosis:   Patient Active Problem List   Diagnosis Date Noted  . MDD (major depressive disorder), recurrent episode, moderate (HCC) [F33.1] 04/07/2017    Priority: High  . Noncompliance [Z91.19] 08/02/2016  . Suicidal ideation [R45.851] 08/02/2016  . Tobacco use disorder [F17.200] 08/02/2016  . Alcohol withdrawal (HCC) [F10.239] 08/02/2016  . PTSD (post-traumatic stress disorder) [F43.10] 08/02/2016  . Cannabis use disorder, moderate, dependence (HCC) [F12.20] 08/01/2016  . Alcohol use disorder, moderate, dependence (HCC) [F10.20] 08/01/2016  . Alcohol use disorder, severe, dependence (HCC) [F10.20] 12/30/2013  . Generalized anxiety disorder [F41.1] 12/30/2012   Total Time spent with patient: 20 minutes  Past Psychiatric History: depression, alcoholism  Past Medical History:  Past Medical History:  Diagnosis Date  . Generalized anxiety disorder   . Major depressive disorder   . Schizoaffective disorder [facility](HCC)     Past Surgical History:  Procedure Laterality Date  . SPLENECTOMY     Family History: History reviewed. No pertinent family history. Family Psychiatric  History: none reported Social History:  Social History   Substance and Sexual Activity  Alcohol Use Yes   Comment: Occ     Social History   Substance and Sexual Activity  Drug Use Yes  . Types: Marijuana   Comment: long ago pain     Social History   Socioeconomic History  . Marital status: Single    Spouse name: None  . Number of children: None  . Years of education: None  . Highest education level: None  Social Needs  . Financial resource strain: None  . Food insecurity - worry: None  . Food insecurity - inability: None  . Transportation needs - medical: None  . Transportation needs - non-medical: None  Occupational History  . None  Tobacco Use  . Smoking status: Current Every Day Smoker    Packs/day: 2.00    Years: 15.00    Pack years: 30.00    Types: Cigarettes  . Smokeless tobacco: Never Used  Substance and Sexual Activity  . Alcohol use: Yes    Comment: Occ  . Drug use: Yes    Types: Marijuana    Comment: long ago pain  . Sexual activity: Yes  Other Topics Concern  . None  Social History Narrative  . None   Additional Social History:                         Sleep: Fair  Appetite:  Fair  Current Medications: Current Facility-Administered Medications  Medication Dose Route Frequency Provider Last Rate Last Dose  . acetaminophen (TYLENOL) tablet 650 mg  650 mg Oral Q6H PRN Clapacs, Jackquline DenmarkJohn T, MD   650 mg at 04/09/17 0844  . alum & mag hydroxide-simeth (MAALOX/MYLANTA) 200-200-20 MG/5ML suspension 30 mL  30 mL Oral Q4H PRN Clapacs, John T, MD      . folic acid (FOLVITE) tablet 1 mg  1 mg Oral Daily Clapacs, Jackquline DenmarkJohn T, MD  1 mg at 04/10/17 0902  . gabapentin (NEURONTIN) tablet 600 mg  600 mg Oral TID Beverly Sessions, MD   600 mg at 04/10/17 1357  . hydrOXYzine (ATARAX/VISTARIL) tablet 50 mg  50 mg Oral TID PRN Haskell Riling, MD   50 mg at 04/09/17 2212  . magnesium hydroxide (MILK OF MAGNESIA) suspension 30 mL  30 mL Oral Daily PRN Clapacs, John T, MD      . multivitamin with minerals tablet 1 tablet  1 tablet Oral Daily Clapacs, Jackquline Denmark, MD   1 tablet at 04/10/17 0902  . nicotine (NICODERM CQ - dosed in mg/24 hours) patch 21 mg  21 mg Transdermal Daily Clapacs, Jackquline Denmark, MD   21 mg at  04/10/17 0901  . OLANZapine (ZYPREXA) tablet 5 mg  5 mg Oral Q6H PRN Beverly Sessions, MD      . ondansetron [facility]) tablet 4 mg  4 mg Oral Q8H PRN Beverly Sessions, MD   4 mg at 04/08/17 1607   Or  . ondansetron (ZOFRAN) injection 4 mg  4 mg Intramuscular Q8H PRN Beverly Sessions, MD      . pantoprazole (PROTONIX) EC tablet 40 mg  40 mg Oral Daily Beverly Sessions, MD   40 mg at 04/10/17 0902  . sertraline (ZOLOFT) tablet 100 mg  100 mg Oral Q breakfast Beverly Sessions, MD   100 mg at 04/10/17 0902  . thiamine (VITAMIN B-1) tablet 100 mg  100 mg Oral Daily Clapacs, John T, MD   100 mg at 04/10/17 0901   Or  . thiamine (B-1) injection 100 mg  100 mg Intravenous Daily Clapacs, Jackquline Denmark, MD   100 mg at 04/08/17 1610  . traZODone (DESYREL) tablet 100 mg  100 mg Oral QHS PRN Clapacs, Jackquline Denmark, MD   100 mg at 04/09/17 2212    Lab Results: No results found for this or any previous visit (from the past 48 hour(s)).  Blood Alcohol level:  Lab Results  Component Value Date   ETH 17 (H) 04/05/2017   ETH 200 (H) 09/10/2016    Metabolic Disorder Labs: Lab Results  Component Value Date   HGBA1C 4.9 04/07/2017   MPG 93.93 04/07/2017   MPG 97 08/02/2016   Lab Results  Component Value Date   PROLACTIN 21.0 (H) 08/02/2016   Lab Results  Component Value Date   CHOL 149 04/07/2017   TRIG 90 04/07/2017   HDL 61 04/07/2017   CHOLHDL 2.4 04/07/2017   VLDL 18 04/07/2017   LDLCALC 70 04/07/2017   LDLCALC 114 (H) 08/02/2016    Physical Findings: AIMS:  , ,  ,  ,    CIWA:  CIWA-Ar Total: 0 COWS:     Musculoskeletal: Strength & Muscle Tone: within normal limits Gait & Station: normal Patient leans: N/A  Psychiatric Specialty Exam: Physical Exam  Nursing note and vitals reviewed. Psychiatric: His speech is normal and behavior is normal. Thought content normal. His mood appears anxious. He expresses impulsivity. He exhibits a depressed mood.    Review of Systems  Neurological:  Negative.   Psychiatric/Behavioral: Positive for substance abuse. The patient is nervous/anxious.   All other systems reviewed and are negative.   Blood pressure 118/64, pulse 70, temperature 98.6 F (37 C), temperature source Oral, resp. rate 18, height 5\' 10"  (1.778 m), weight 84.4 kg (186 lb), SpO2 99 %.Body mass index is 26.69 kg/m.  General Appearance: Casual  Eye Contact:  Good  Speech:  Clear and Coherent  Volume:  Normal  Mood:  Anxious  Affect:  Congruent  Thought Process:  Goal Directed and Descriptions of Associations: Intact  Orientation:  Full (Time, Place, and Person)  Thought Content:  WDL  Suicidal Thoughts:  No  Homicidal Thoughts:  No  Memory:  Immediate;   Fair Recent;   Fair Remote;   Fair  Judgement:  Poor  Insight:  Lacking  Psychomotor Activity:  Normal  Concentration:  Concentration: Fair and Attention Span: Fair  Recall:  [employer] of Knowledge:  Fair  Language:  Fair  Akathisia:  No  Handed:  Right  AIMS (if indicated):     Assets:  Communication Skills Desire for Improvement Housing Intimacy Physical Health Resilience Social Support  ADL's:  Intact  Cognition:  WNL  Sleep:  Number of Hours: 7.25     Treatment Plan Summary: Daily contact with patient to assess and evaluate symptoms and progress in treatment and Medication management   32 yo male admitted due to worsening depression and SI, also with alcohol abuse. He is having difficulty transitioning from prison to everyday life. He has past diagnosis in the chart of schizoaffective disorder but has very long history of substance abuse clouding this diagnosis. He does not appear manic or psychotic at all currently and this diagnosis was likely in the context of drug use (used cocaine, acid and heroin heavily in the past). Patient is still depressed, anxious, .  Plan:  SI- contracts for safety in hospital;  treating  depression  MDD/Anxiety -Restarted Zoloft  and Gabapentin, dose  decreased due to nausea   Alcohol abuse/W/D -CIWA completed -Gabapentin 900 mg TID -Pt not interested in residential or IOP  Dispo -Pt will discharge home when stable and follow up with [facility]    Kristine Linea, MD 04/10/2017, 4:00 PM

## 2017-04-11 MED ORDER — TRAZODONE HCL 100 MG PO TABS: 100.0000 mg | ORAL_TABLET | Freq: Every evening | ORAL | 1 refills | Status: AC | PRN

## 2017-04-11 MED ORDER — SERTRALINE HCL 100 MG PO TABS: 150.0000 mg | ORAL_TABLET | Freq: Every day | ORAL | 1 refills | Status: AC

## 2017-04-11 MED ORDER — GABAPENTIN 600 MG PO TABS: 600.0000 mg | ORAL_TABLET | Freq: Three times a day (TID) | ORAL | 1 refills | Status: AC

## 2017-04-11 MED ORDER — HYDROXYZINE HCL 50 MG PO TABS: 50.0000 mg | ORAL_TABLET | Freq: Three times a day (TID) | ORAL | 1 refills | Status: AC | PRN

## 2017-04-11 MED ORDER — PANTOPRAZOLE SODIUM 40 MG PO TBEC: 40.0000 mg | DELAYED_RELEASE_TABLET | Freq: Every day | ORAL | 1 refills | Status: AC

## 2017-04-11 NOTE — Progress Notes (Signed)
  [facility] :  Will you be returning to the same living situation after discharge:  Yes,  returning home. At discharge, do you have transportation home?: Yes,  pt's fiance will transport home. Do you have the ability to pay for your medications: Yes,  fiance.  Release of information consent forms completed and in the chart;  Patient's signature needed at discharge.  Patient to Follow up at: Follow-up Information    [facility] Go on 04/14/2017.   Why:  Please go to your hospital follow up appoitnment on Friday, 04/14/17 at 12:30PM. Thank you! Contact information: [address] [place] [address] [phone]           Next level of care provider has [facility]:no  Safety Planning and Suicide Prevention discussed: Yes,  with pt and his fiance.  Have you used any form of tobacco in the last 30 days? (Cigarettes, Smokeless Tobacco, Cigars, and/or Pipes): Yes  Has patient been referred to the Quitline?: Patient refused referral  Patient has been referred for addiction treatment: N/A; however, pt will be doing outpatient and peer treatment with [facility].  Heidi DachKelsey Suhani Stillion, LCSW 04/11/2017, 8:50 AM

## 2017-04-11 NOTE — Plan of Care (Signed)
Patient slept for Estimated Hours of 7; Precautionary checks every 15 minutes for safety maintained, room free of safety hazards, patient sustains no injury or falls during this shift.  

## 2017-04-11 NOTE — Progress Notes (Signed)
Recreation Therapy Notes  Date: 01.22.2019  Time: 1:00 PM  Location: Craft Room  Behavioral response: N/A   Intervention Topic: Creative Expressions  Discussion/Intervention: Patient did not attend group. Clinical Observations/Feedback:  Patient did not attend group.  Bianco Cange LRT/CTRS         Aalina Brege 04/11/2017 2:09 PM 

## 2017-04-11 NOTE — BHH Group Notes (Signed)
04/11/2017 0930  Type of Therapy/Topic:  Group Therapy:  Feelings about Diagnosis  Participation Level:  Active   Description of Group:   This group will allow patients to explore their thoughts and feelings about diagnoses they have received. Patients will be guided to explore their level of understanding and acceptance of these diagnoses. Facilitator will encourage patients to process their thoughts and feelings about the reactions of others to their diagnosis and will guide patients in identifying ways to discuss their diagnosis with significant others in their lives. This group will be process-oriented, with patients participating in exploration of their own experiences, giving and receiving support, and processing challenge from other group members.   Therapeutic Goals: 1. Patient will demonstrate understanding of diagnosis as evidenced by identifying two or more symptoms of the disorder 2. Patient will be able to express two feelings regarding the diagnosis 3. Patient will demonstrate their ability to communicate their needs through discussion and/or role play  Summary of Patient Progress: Pt continues to work towards their tx goals but has not yet reached them. Pt was able to appropriately participate in group discussion, and was able to offer support/validation to other group members. Pt reported he is feeling, "pretty good. I'm going home today." Pt reported getting his first mental health diagnosis at age 32, "I have ADD and I knew that I was different and I needed medication." Pt reported having multiple diagnoses over time, "I have anxiety and depression--and I think my substance use is me trying to self medicated." Pt reported, "It's none of any one's business about my diagnosis. I don't think I need to explain it to anyone."  Therapeutic Modalities:   Cognitive Behavioral Therapy Brief Therapy  Heidi DachKelsey Trejan Buda, MSW, LCSW 04/11/2017 10:28 AM

## 2017-04-11 NOTE — B[facility] Suicide Risk Assessment (Signed)
[facility]   Principal Problem: MDD (major depressive disorder), recurrent episode, moderate (HCC) Discharge Diagnoses:  Patient Active Problem List   Diagnosis Date Noted  . MDD (major depressive disorder), recurrent episode, moderate (HCC) [F33.1] 04/07/2017    Priority: High  . Alcohol withdrawal (HCC) [F10.239] 08/02/2016    Priority: Medium  . Alcohol use disorder, moderate, dependence (HCC) [F10.20] 08/01/2016    Priority: Medium  . Noncompliance [Z91.19] 08/02/2016  . Suicidal ideation [R45.851] 08/02/2016  . Tobacco use disorder [F17.200] 08/02/2016  . PTSD (post-traumatic stress disorder) [F43.10] 08/02/2016  . Cannabis use disorder, moderate, dependence (HCC) [F12.20] 08/01/2016  . Alcohol use disorder, severe, dependence (HCC) [F10.20] 12/30/2013  . Generalized anxiety disorder [F41.1] 12/30/2012      Mental Status Per Nursing Assessment::   On Admission:     Demographic Factors:  Male, Caucasian and Unemployed  Loss Factors: Legal issues  Historical Factors: Impulsivity  Risk Reduction Factors:   Living with another person, especially a relative, Positive social support, Positive therapeutic relationship and Positive coping skills or problem solving skills  Continued Clinical Symptoms:  Alcohol/Substance Abuse/Dependencies  Cognitive Features That Contribute To Risk:  None    Suicide Risk:  Minimal: No identifiable suicidal ideation.  Patients presenting with no risk factors but with morbid ruminations; may be classified as minimal risk based on the severity of the depressive symptoms  Follow-up Information    [facility] Go on 04/14/2017.   Why:  Please go to your hospital follow up appoitnment on Friday, 04/14/17 at 12:30PM. Thank you! Contact information: [address] [place] [address] [phone]           Plan Of Care/Follow-up recommendations: Follow up with [facility]  Haskell RilingHolly R Tevyn Codd,  MD 04/11/2017, 8:58 AM

## 2017-04-11 NOTE — Progress Notes (Signed)
Affect constricted.  Irritable. Denies SI/HI/AVH. Discharge instructions given, verbalized understanding.  Prescriptions and seven day supply of medications given.  Personal belongings returned.  Escorted off unit by this Clinical research associatewriter to main entrance to catch [employer]oad[employer] to travel home.

## 2017-04-11 NOTE — Progress Notes (Signed)
Patient ID: Patrick [name], male   DOB: 12-30-1985, 32 y.o.   MRN: [id number] Observed in the day room watching TV with others, guarded on approach but quickly appropriate as I introduced myself; no behavioral problem until he was provoked by another patient; he acted as grown-up and yielded to redirection from tension. "I just got released from the prison, that's why you have not seeing me around; not a big deal; I have been in the prison 4 times; but I am going home tomorrow for my daughter; she is 668 months old...." Denied SI/HI/AVH and anticipating discharge in the morning.

## 2017-04-11 NOTE — Discharge Summary (Signed)
Physician Discharge Summary Note  Patient:  Patrick [name] is an 32 y.o., male MRN:  [id number] DOB:  [birth year] Patient phone:  [phone] (home)  Patient address:   [address] [place] [address],  Total Time spent with patient: 15 minutes  Plus 20 minutes of medication reconciliation, discharge planning, and discharge documentation   Date of Admission:  04/06/2017 Date of Discharge: 04/11/17  Reason for Admission:  32 yo male admitted due to worsening depression and suicidal thoughts. Pt states that he has struggled with anxiety off and on for many years. He states that when he gets into a depressive episode he gets very fatigued, its very difficult to get anything done, hard to get out of bed. These usually last 2-3 days and then he slowly comes out of it. He has done various stints of prison time. He got out recently after doing 8 months. He was on Zoloft during that time and was helpful for anxiety and depression. He states that "the depression isn't completely gone but it is helping a lot." he states that he has found the transition from prison to society difficult for him. He states that he was in a prison gang and had a sense of community there but now "I am just an ordinary person." He feels that he does not have much of a purpose. He has been gradually feeling more depressed and was starting have suicidal thoughts with plan to walk into traffic. He states, "I don't want to kill myself but I do." He states that he has been self medicating with alcohol. He drinks about 6-7 24oz beers that are 8 percent alcohol. He states that he feels tremulous in the morning so will have a beer int he morning to feel better but usually only drinks at night. He does report history of w/d seizures in the past. He is living with his fiance and they recently got approved for a new house in [place] so will be moving there soon. They have a 90 month old daughter which keeps him going. He is not interested  in residential treatment or even IOP because he needs to get a job to help his wife pay the bills. He does not like AA because of the religious component.  He states that he would ike help detoxing and help with anxiety. He states that he has always struggled with anxiety and "I'm always wound up real tight."     Principal Problem: MDD (major depressive disorder), recurrent episode, moderate (HCC) Discharge Diagnoses: Patient Active Problem List   Diagnosis Date Noted  . MDD (major depressive disorder), recurrent episode, moderate (HCC) [F33.1] 04/07/2017    Priority: High  . Alcohol withdrawal (HCC) [F10.239] 08/02/2016    Priority: Medium  . Alcohol use disorder, moderate, dependence (HCC) [F10.20] 08/01/2016    Priority: Medium  . Noncompliance [Z91.19] 08/02/2016  . Suicidal ideation [R45.851] 08/02/2016  . Tobacco use disorder [F17.200] 08/02/2016  . PTSD (post-traumatic stress disorder) [F43.10] 08/02/2016  . Cannabis use disorder, moderate, dependence (HCC) [F12.20] 08/01/2016  . Alcohol use disorder, severe, dependence (HCC) [F10.20] 12/30/2013  . Generalized anxiety disorder [F41.1] 12/30/2012    Past Psychiatric History: See H&p  Past Medical History:  Past Medical History:  Diagnosis Date  . Generalized anxiety disorder   . Major depressive disorder   . Schizoaffective disorder [facility])     Past Surgical History:  Procedure Laterality Date  . SPLENECTOMY     Family History: History reviewed. No pertinent family history.  Family Psychiatric  History: See H&P Social History:  Social History   Substance and Sexual Activity  Alcohol Use Yes   Comment: Occ     Social History   Substance and Sexual Activity  Drug Use Yes  . Types: Marijuana   Comment: long ago pain    Social History   Socioeconomic History  . Marital status: Single    Spouse name: None  . Number of children: None  . Years of education: None  . Highest education level: None  Social Needs   . Financial resource strain: None  . Food insecurity - worry: None  . Food insecurity - inability: None  . Transportation needs - medical: None  . Transportation needs - non-medical: None  Occupational History  . None  Tobacco Use  . Smoking status: Current Every Day Smoker    Packs/day: 2.00    Years: 15.00    Pack years: 30.00    Types: Cigarettes  . Smokeless tobacco: Never Used  Substance and Sexual Activity  . Alcohol use: Yes    Comment: Occ  . Drug use: Yes    Types: Marijuana    Comment: long ago pain  . Sexual activity: Yes  Other Topics Concern  . None  Social History Narrative  . None    Hospital Course:  Pt was admitted and restarted on home Zoloft 150 mg daily. He was started on CIWA protocol with Gabapentin and prn Ativan. He was very benzo seeking during admission. Pt tolerated medications well with no side effects. He did have one fall and CT head was negative. On day of discharge, he stated that he was feeling much better. He denied SI or any thoughts of self harm. He feels like he is through detox. He is sleeping well. He declined any residential or outpatient alcohol treatment. HE states that he "needs to stay sober" because he is afraid that if he goes back to prison that his wife would leave him. HE also has a 10 month old daughter that he wants to be there for. He states, "I missed too many milestones already." He is future oriented and wants to get a job soon. He is organized and goal directed. HE does not appear manic or psychotic, Denies AH, VH. He has been calm on the unit. He is requesting discharge and does not meet IVC criteria.   The patient is at low risk of imminent suicide. Patient denied thoughts, intent, or plan for harm to self or others, expressed significant future orientation, and expressed an ability to mobilize assistance for his/her needs. he is presently void of any contributing psychiatric symptoms, cognitive difficulties, or substance use  which would elevate his risk for lethality. Chronic risk for lethality is elevated in light of alcohol use, history of impulsivity, male gender. The chronic risk is presently mitigated by his/her ongoing desire and engagement in [facility] treatment and mobilization of support from family and friends. Chronic risk may elevate if he experiences any significant loss or worsening of symptoms, which can be managed and monitored through outpatient providers. At this time,a cute risk for lethality is low and he is stable for ongoing outpatient management.   Modifiable risk factors were addressed during this hospitalization through appropriate pharmacotherapy and establishment of outpatient follow-up treatment. Some risk factors for suicide are situational (i.e. Unstable housing) or related personality pathology (i.e. Poor coping mechanisms) and thus cannot be further mitigated by continued hospitalization in this setting.    Physical Findings: AIMS:  , ,  ,  ,  CIWA:  CIWA-Ar Total: 0 COWS:     Musculoskeletal: Strength & Muscle Tone: within normal limits Gait & Station: normal Patient leans: N/A  Psychiatric Specialty Exam: Physical Exam  Nursing note and vitals reviewed.   Review of Systems  All other systems reviewed and are negative.   Blood pressure 114/69, pulse (!) 56, temperature 98.6 F (37 C), temperature source Oral, resp. rate 18, height 5\' 10"  (1.778 m), weight 84.4 kg (186 lb), SpO2 99 %.Body mass index is 26.69 kg/m.  General Appearance: Casual  Eye Contact:  Good  Speech:  Clear and Coherent  Volume:  Normal  Mood:  Euthymic  Affect:  Appropriate  Thought Process:  Coherent and Goal Directed  Orientation:  Full (Time, Place, and Person)  Thought Content:  Logical  Suicidal Thoughts:  No  Homicidal Thoughts:  No  Memory:  Immediate;   Fair  Judgement:  Fair  Insight:  Fair  Psychomotor Activity:  Normal  Concentration:  Concentration: Fair  Recall:  Fair  Fund of  Knowledge:  Fair  Language:  Fair  Akathisia:  No      Assets:  Communication Skills Desire for Improvement Housing Resilience Social Support  ADL's:  Intact  Cognition:  WNL  Sleep:  Number of Hours: 7     Have you used any form of tobacco in the last 30 days? (Cigarettes, Smokeless Tobacco, Cigars, and/or Pipes): Yes  Has this patient used any form of tobacco in the last 30 days? (Cigarettes, Smokeless Tobacco, Cigars, and/or Pipes) Yes, Yes, A prescription for an FDA-approved tobacco cessation medication was offered at discharge and the patient refused  Blood Alcohol level:  Lab Results  Component Value Date   ETH 17 (H) 04/05/2017   ETH 200 (H) 09/10/2016    Metabolic Disorder Labs:  Lab Results  Component Value Date   HGBA1C 4.9 04/07/2017   MPG 93.93 04/07/2017   MPG 97 08/02/2016   Lab Results  Component Value Date   PROLACTIN 21.0 (H) 08/02/2016   Lab Results  Component Value Date   CHOL 149 04/07/2017   TRIG 90 04/07/2017   HDL 61 04/07/2017   CHOLHDL 2.4 04/07/2017   VLDL 18 04/07/2017   LDLCALC 70 04/07/2017   LDLCALC 114 (H) 08/02/2016    See Psychiatric Specialty Exam and Suicide Risk Assessment completed by Attending Physician prior to discharge.  Discharge destination:  Home  Is patient on multiple antipsychotic therapies at discharge:  No   Has Patient had three or more failed trials of antipsychotic monotherapy by history:  No  Recommended Plan for Multiple Antipsychotic Therapies: NA  Discharge Instructions    Diet - low sodium heart healthy   Complete by:  As directed    Increase activity slowly   Complete by:  As directed    Increase activity slowly   Complete by:  As directed      Allergies as of 04/11/2017      Reactions   Sulfa Antibiotics Anaphylaxis   Antihistamines, Chlorpheniramine-type Hives   Diphenhydramine Hcl Palpitations      Medication List    STOP taking these medications   QUEtiapine 100 MG  tablet Commonly known as:  SEROQUEL   QUEtiapine 200 MG tablet Commonly known as:  SEROQUEL     TAKE these medications     Indication  gabapentin 600 MG tablet Commonly known as:  NEURONTIN Take 1 tablet (600 mg total) by mouth 3 (three) times daily.  Indication:  Neuropathic Pain  hydrOXYzine 50 MG tablet Commonly known as:  ATARAX/VISTARIL Take 1 tablet (50 mg total) by mouth 3 (three) times daily as needed for anxiety.  Indication:  Feeling Anxious   pantoprazole 40 MG tablet Commonly known as:  PROTONIX Take 1 tablet (40 mg total) by mouth daily.  Indication:  Gastroesophageal Reflux Disease   sertraline 100 MG tablet Commonly known as:  ZOLOFT Take 1.5 tablets (150 mg total) by mouth daily with breakfast.  Indication:  Major Depressive Disorder   traZODone 100 MG tablet Commonly known as:  DESYREL Take 1 tablet (100 mg total) by mouth at bedtime as needed for sleep.  Indication:  Trouble Sleeping      Follow-up Information    [facility] Go on 04/14/2017.   Why:  Please go to your hospital follow up appoitnment on Friday, 04/14/17 at 12:30PM. Thank you! Contact information: [address] [place] [address] [phone]           Follow-up recommendations: Follow up with [facility]  Signed: Haskell Riling, MD 04/11/2017, 8:59 AM

## 2017-04-11 NOTE — Progress Notes (Signed)
Recreation Therapy Notes  INPATIENT RECREATION TR PLAN  Patient Details Name: Patrick [name] MRN: [id number] DOB: 1986/01/27 Today's Date: 04/11/2017  Rec Therapy Plan Is patient appropriate for Therapeutic Recreation?: Yes Treatment times per week: at least 3 Estimated Length of Stay: 5-7 days TR Treatment/Interventions: Group participation (Comment)  Discharge Criteria Pt will be discharged from therapy if:: Discharged Treatment plan/goals/alternatives discussed and agreed upon by:: Patient/family  Discharge Summary Short term goals set: Patient will find 3 new coping skills to use post-discharge x5 days.  Short term goals met: Adequate for discharge Progress toward goals comments: Groups attended Which groups?: Goal setting(Emotions) Reason goals not met: N/A Therapeutic equipment acquired: N/A Reason patient discharged from therapy: Discharge from hospital Pt/family agrees with progress & goals achieved: Yes Date patient discharged from therapy: 04/11/17   Vasiliy Mccarry 04/11/2017, 4:48 PM

## 2019-08-19 IMAGING — CT CT HEAD W/O CM
4 series · 17 of 47 positions shown, 19 images · non-contrast
Comparison: None.

CLINICAL DATA: Head injury

EXAM:
CT HEAD WITHOUT CONTRAST
TECHNIQUE: Contiguous axial images were obtained from the base of the skull
through the vertex without intravenous contrast.

[Series 2: head wo · axial · 0.47mm/px · z∈[-222,-107]mm · 7 of 31 slices shown, 9 images]
[im 4/31  brain]
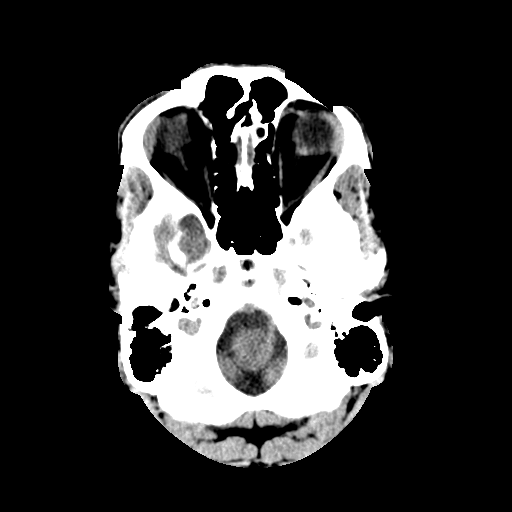
[im 4/31  bone]
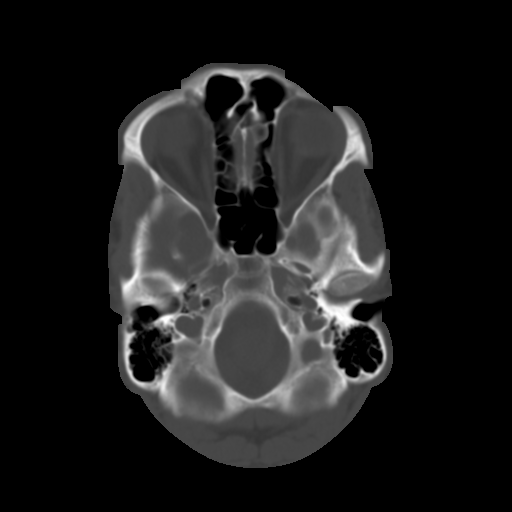
[im 8/31  brain]
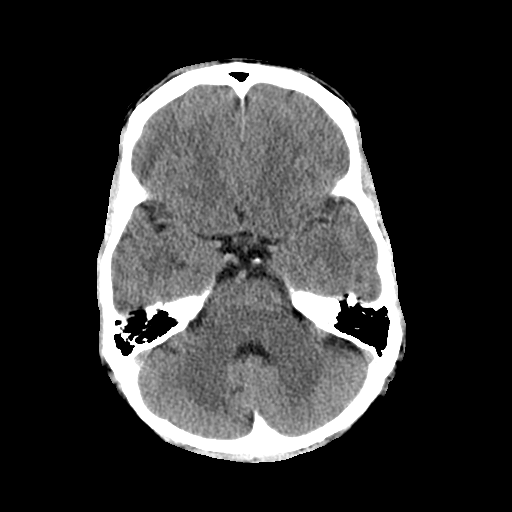
[im 12/31  brain]
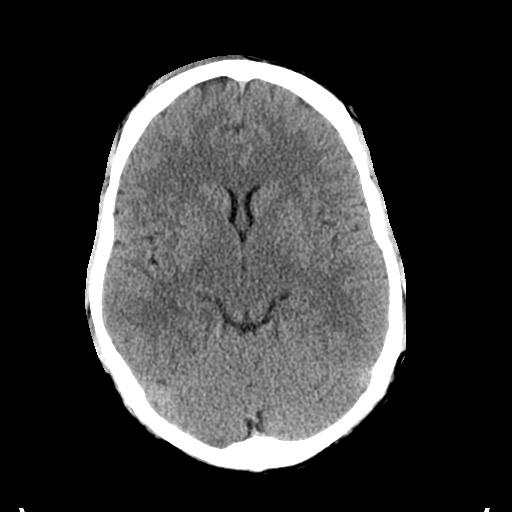
[im 16/31  brain]
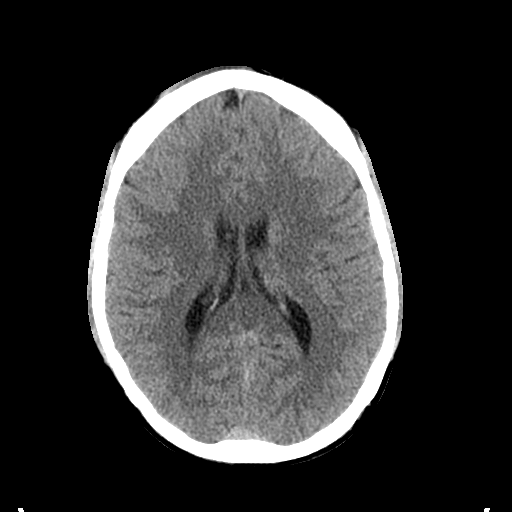
[im 19/31  brain]
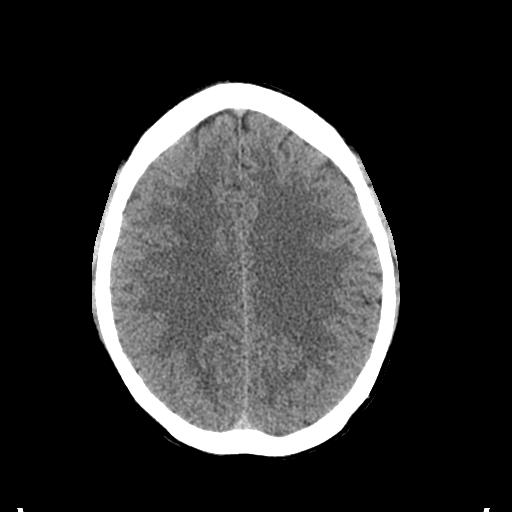
[im 19/31  bone]
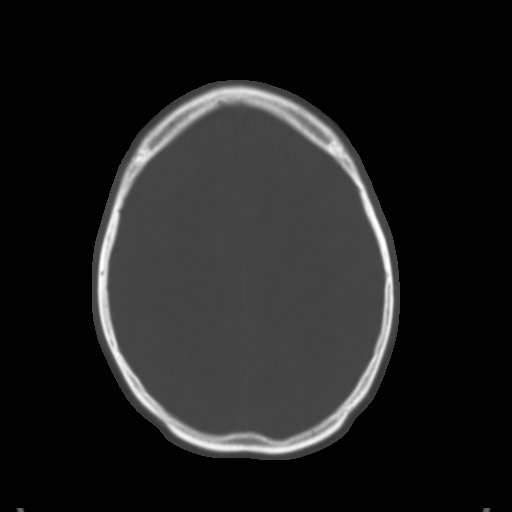
[im 23/31  brain]
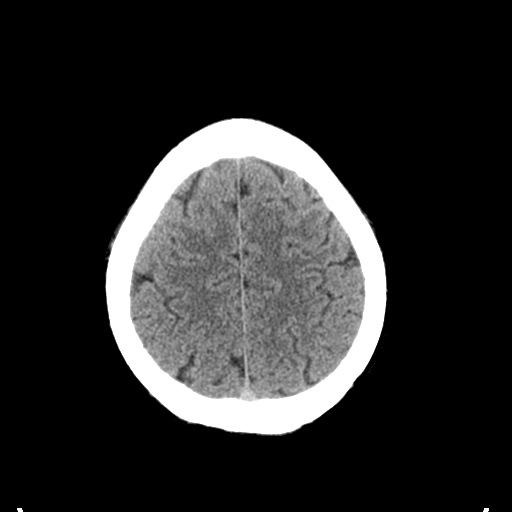
[im 27/31  brain]
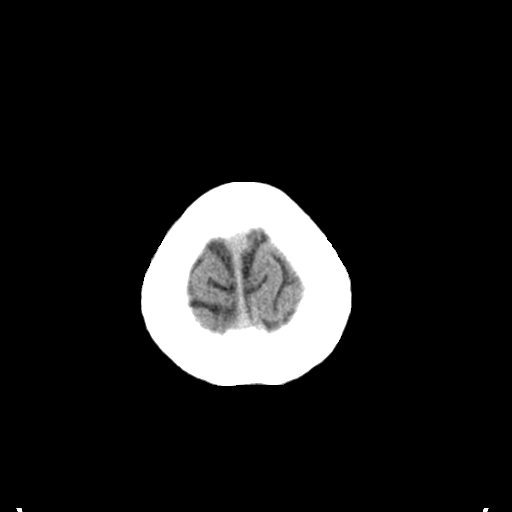

[Series 3: head bone · axial · 0.47mm/px · z∈[-223,-169]mm · 4 of 78 slices shown]
[im 8/78  bone]
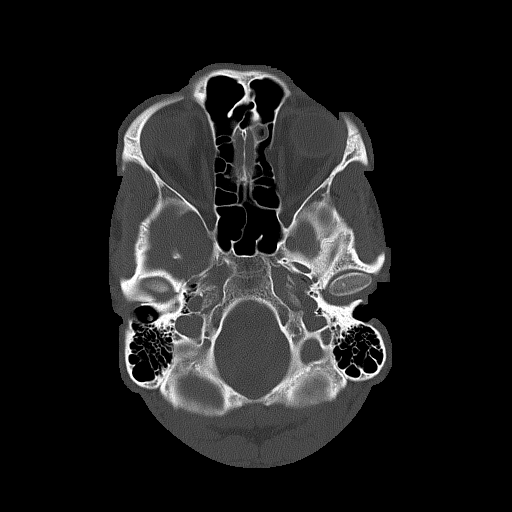
[im 16/78  bone]
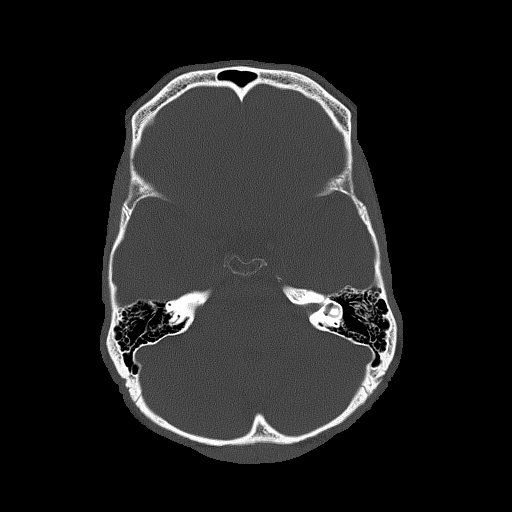
[im 24/78  bone]
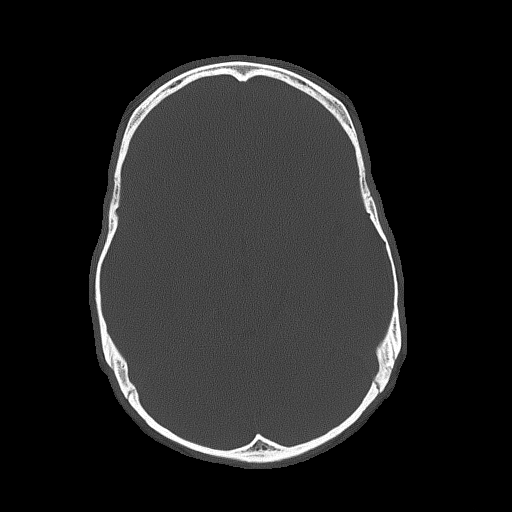
[im 35/78  bone]
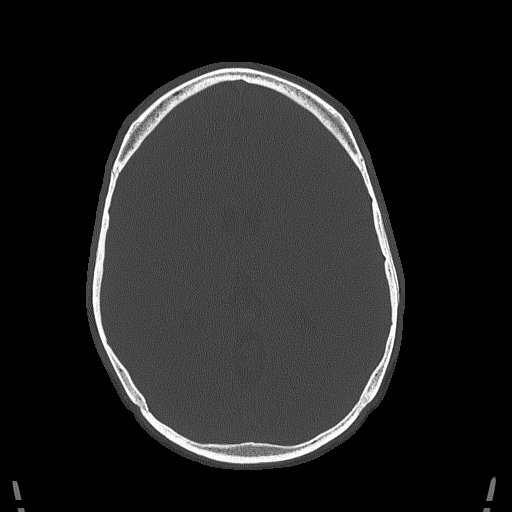

[Series 4: coronal soft tissue · coronal · 0.33mm/px · 3 of 68 slices shown]
[im 23/68  brain]
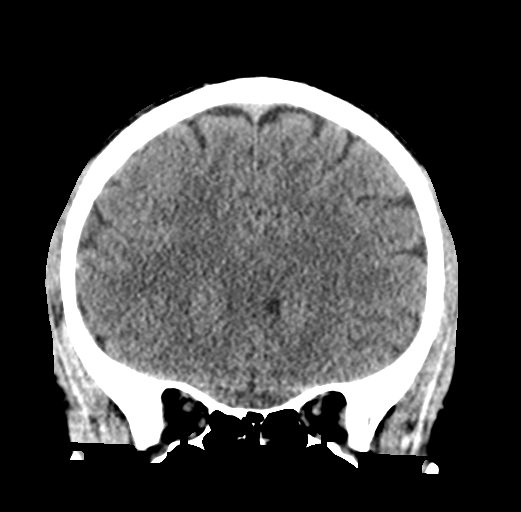
[im 30/68  brain]
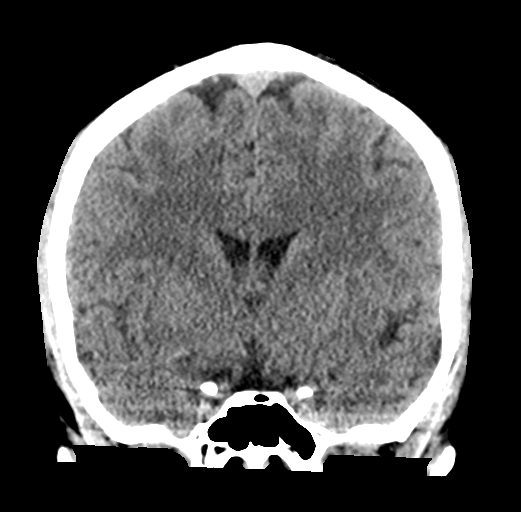
[im 38/68  brain]
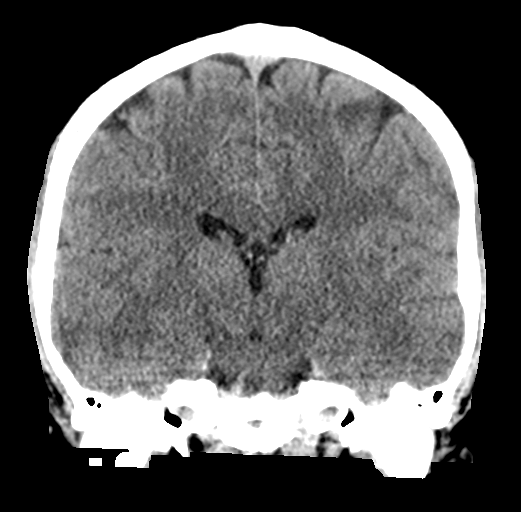

[Series 5: sagittal soft tissue · sagittal · 0.33mm/px · 3 of 57 slices shown]
[im 19/57  brain]
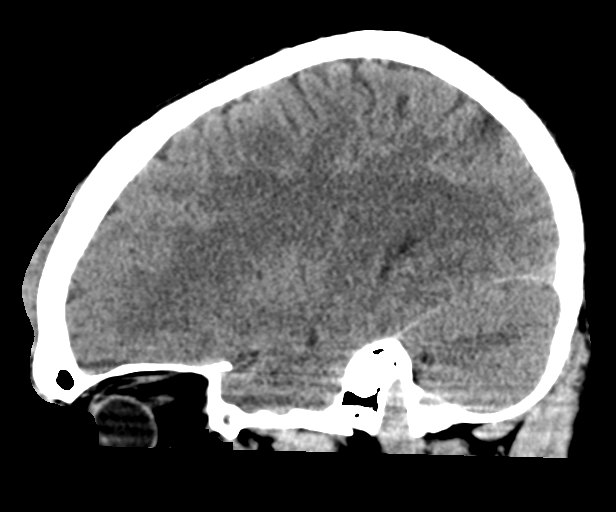
[im 29/57  brain]
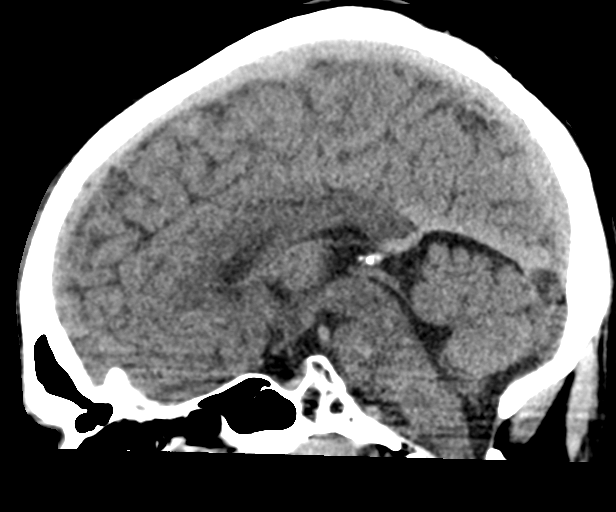
[im 38/57  brain]
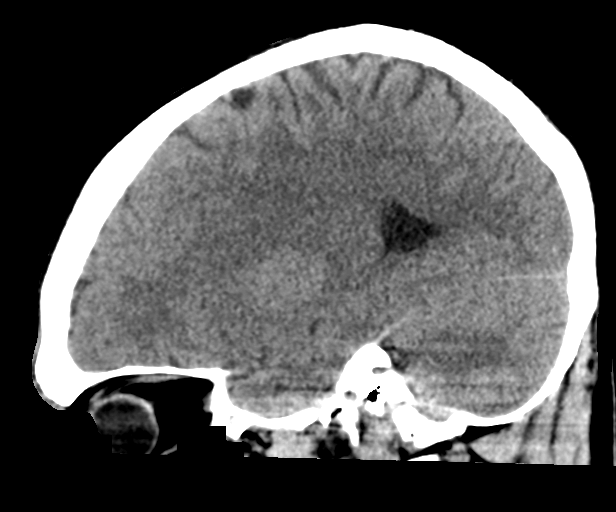

[17 of 47 positions shown; findings below may reference images not displayed]

FINDINGS: Brain: No evidence of acute infarction, hemorrhage, hydrocephalus,
extra-axial collection or mass lesion/mass effect.

Vascular: Negative for hyperdense vessel

Skull: Right frontal scalp hematoma.  Negative for skull fracture.

Sinuses/Orbits: Negative

Other: None
IMPRESSION: Negative CT of the brain. Right frontal scalp hematoma without skull
fracture.
